# Patient Record
Sex: Female | Born: 1975 | Race: White | Hispanic: Yes | Marital: Married | Smoking: Never smoker
Health system: Southern US, Community
[De-identification: ages and names within clinical notes are randomized; demographics above are authoritative.]

## PROBLEM LIST (undated history)

## (undated) DIAGNOSIS — D649 Anemia, unspecified: Secondary | ICD-10-CM

## (undated) DIAGNOSIS — D249 Benign neoplasm of unspecified breast: Secondary | ICD-10-CM

## (undated) HISTORY — DX: Anemia, unspecified: D64.9

## (undated) HISTORY — DX: Benign neoplasm of unspecified breast: D24.9

---

## 2006-10-04 ENCOUNTER — Other Ambulatory Visit: Admission: RE | Admit: 2006-10-04 | Discharge: 2006-10-04 | Payer: Self-pay | Admitting: Family Medicine

## 2008-01-16 ENCOUNTER — Other Ambulatory Visit: Admission: RE | Admit: 2008-01-16 | Discharge: 2008-01-16 | Payer: Self-pay | Admitting: Family Medicine

## 2008-10-06 ENCOUNTER — Emergency Department (HOSPITAL_COMMUNITY): Admission: EM | Admit: 2008-10-06 | Discharge: 2008-10-06 | Payer: Self-pay | Admitting: Emergency Medicine

## 2008-10-08 ENCOUNTER — Inpatient Hospital Stay (HOSPITAL_COMMUNITY): Admission: AD | Admit: 2008-10-08 | Discharge: 2008-10-08 | Payer: Self-pay | Admitting: Obstetrics and Gynecology

## 2008-10-19 ENCOUNTER — Inpatient Hospital Stay (HOSPITAL_COMMUNITY): Admission: AD | Admit: 2008-10-19 | Discharge: 2008-10-19 | Payer: Self-pay | Admitting: Obstetrics and Gynecology

## 2009-03-17 ENCOUNTER — Other Ambulatory Visit: Admission: RE | Admit: 2009-03-17 | Discharge: 2009-03-17 | Payer: Self-pay | Admitting: Family Medicine

## 2010-03-16 ENCOUNTER — Other Ambulatory Visit: Payer: Self-pay | Admitting: Obstetrics & Gynecology

## 2010-03-19 ENCOUNTER — Inpatient Hospital Stay (HOSPITAL_COMMUNITY): Admission: AD | Admit: 2010-03-19 | Discharge: 2010-03-22 | Payer: Self-pay | Admitting: Obstetrics and Gynecology

## 2010-11-12 LAB — CBC
HCT: 38.8 % (ref 36.0–46.0)
MCH: 31.9 pg (ref 26.0–34.0)
MCH: 32 pg (ref 26.0–34.0)
MCH: 32 pg (ref 26.0–34.0)
MCHC: 34.3 g/dL (ref 30.0–36.0)
MCHC: 34.4 g/dL (ref 30.0–36.0)
MCHC: 34.7 g/dL (ref 30.0–36.0)
MCV: 92 fL (ref 78.0–100.0)
MCV: 93 fL (ref 78.0–100.0)
MCV: 93.1 fL (ref 78.0–100.0)
Platelets: 131 10*3/uL — ABNORMAL LOW (ref 150–400)
Platelets: 145 10*3/uL — ABNORMAL LOW (ref 150–400)
Platelets: 149 10*3/uL — ABNORMAL LOW (ref 150–400)
RBC: 3.37 MIL/uL — ABNORMAL LOW (ref 3.87–5.11)
RDW: 13.6 % (ref 11.5–15.5)
RDW: 13.7 % (ref 11.5–15.5)

## 2010-11-12 LAB — RPR: RPR Ser Ql: NONREACTIVE

## 2010-12-13 LAB — ABO/RH: ABO/RH(D): A POS

## 2010-12-13 LAB — URINALYSIS, ROUTINE W REFLEX MICROSCOPIC
Glucose, UA: NEGATIVE mg/dL
Protein, ur: >300 mg/dL — AB
Specific Gravity, Urine: 1.009 (ref 1.005–1.030)
Urobilinogen, UA: 0.2 mg/dL (ref 0.0–1.0)

## 2010-12-13 LAB — HCG, QUANTITATIVE, PREGNANCY
hCG, Beta Chain, Quant, S: 61 m[IU]/mL — ABNORMAL HIGH (ref <5)
hCG, Beta Chain, Quant, S: 85 m[IU]/mL — ABNORMAL HIGH (ref <5)
hCG, Beta Chain, Quant, S: <2 m[IU]/mL (ref <5)

## 2010-12-13 LAB — POCT I-STAT, CHEM 8
Chloride: 106 mEq/L (ref 96–112)
HCT: 44 % (ref 36.0–46.0)
Potassium: 3.8 mEq/L (ref 3.5–5.1)
Sodium: 140 mEq/L (ref 135–145)

## 2010-12-13 LAB — CBC
Platelets: 225 10*3/uL (ref 150–400)
RBC: 4.89 MIL/uL (ref 3.87–5.11)
WBC: 11.1 10*3/uL — ABNORMAL HIGH (ref 4.0–10.5)

## 2010-12-13 LAB — WET PREP, GENITAL: Trich, Wet Prep: NONE SEEN

## 2010-12-13 LAB — URINE MICROSCOPIC-ADD ON

## 2010-12-13 LAB — URINE CULTURE: Colony Count: >=100000

## 2010-12-13 LAB — DIFFERENTIAL
Lymphocytes Relative: 10 % — ABNORMAL LOW (ref 12–46)
Lymphs Abs: 1.1 10*3/uL (ref 0.7–4.0)
Neutrophils Relative %: 85 % — ABNORMAL HIGH (ref 43–77)

## 2014-03-12 ENCOUNTER — Emergency Department (HOSPITAL_COMMUNITY)
Admission: EM | Admit: 2014-03-12 | Discharge: 2014-03-12 | Disposition: A | Discharge status: Home / Self Care | Payer: BC Managed Care – PPO | Attending: Emergency Medicine | Admitting: Emergency Medicine

## 2014-03-12 ENCOUNTER — Encounter (HOSPITAL_COMMUNITY): Payer: Self-pay | Admitting: Emergency Medicine

## 2014-03-12 DIAGNOSIS — T63481A Toxic effect of venom of other arthropod, accidental (unintentional), initial encounter: Secondary | ICD-10-CM

## 2014-03-12 DIAGNOSIS — Y93H2 Activity, gardening and landscaping: Secondary | ICD-10-CM | POA: Insufficient documentation

## 2014-03-12 DIAGNOSIS — T6391XA Toxic effect of contact with unspecified venomous animal, accidental (unintentional), initial encounter: Secondary | ICD-10-CM | POA: Insufficient documentation

## 2014-03-12 DIAGNOSIS — Y9289 Other specified places as the place of occurrence of the external cause: Secondary | ICD-10-CM | POA: Insufficient documentation

## 2014-03-12 DIAGNOSIS — T63461A Toxic effect of venom of wasps, accidental (unintentional), initial encounter: Secondary | ICD-10-CM | POA: Insufficient documentation

## 2014-03-12 MED ORDER — HYDROCODONE-ACETAMINOPHEN 5-325 MG PO TABS: 1.0000 | ORAL_TABLET | Freq: Four times a day (QID) | ORAL | Status: DC | PRN

## 2014-03-12 MED ORDER — DEXAMETHASONE SODIUM PHOSPHATE 4 MG/ML IJ SOLN
10.0000 mg | Freq: Once | INTRAMUSCULAR | Status: AC
  Administered 2014-03-12: 10 mg via INTRAMUSCULAR
  Filled 2014-03-12: qty 3

## 2014-03-12 MED ORDER — DIPHENHYDRAMINE HCL 25 MG PO CAPS
25.0000 mg | ORAL_CAPSULE | Freq: Once | ORAL | Status: AC
  Administered 2014-03-12: 25 mg via ORAL
  Filled 2014-03-12: qty 1

## 2014-03-12 MED ORDER — IBUPROFEN 800 MG PO TABS: 800.0000 mg | ORAL_TABLET | Freq: Three times a day (TID) | ORAL | Status: DC | PRN

## 2014-03-12 MED ORDER — PREDNISONE 50 MG PO TABS: 50.0000 mg | ORAL_TABLET | Freq: Every day | ORAL | Status: DC

## 2014-03-12 MED ORDER — HYDROCODONE-ACETAMINOPHEN 5-325 MG PO TABS
1.0000 | ORAL_TABLET | Freq: Once | ORAL | Status: AC
  Administered 2014-03-12: 1 via ORAL
  Filled 2014-03-12: qty 1

## 2014-03-12 MED ORDER — DEXAMETHASONE SODIUM PHOSPHATE 10 MG/ML IJ SOLN
10.0000 mg | Freq: Once | INTRAMUSCULAR | Status: DC
  Filled 2014-03-12: qty 1

## 2014-03-12 NOTE — ED Notes (Addendum)
Pt was gardening; was bitten by unknown vermin or insect. Right ring finger swollen. Denies allergeries. Did not take OTC

## 2014-03-12 NOTE — ED Provider Notes (Signed)
CSN: [id number]     Arrival date & time 03/12/14  1957 History  This chart was scribed for Dalia Heading, PA-C working with Merryl Hacker, MD by Randa Evens, ED Scribe. This patient was seen in room TR05C/TR05C and the patient's care was started at 8:14 PM.     Chief Complaint  Patient presents with  . Finger Injury   The history is provided by the patient. No language interpreter was used.   HPI Comments: Martha Blankenship is a 38 y.o. female who presents to the Emergency Department complaining of right finger injury to the 4th digit onset 30 minutes prior to arriavl. She states she may have been bit by an insect. She states the finger has started to swell and turn red. She denies any other symptoms.   History reviewed. No pertinent past medical history. History reviewed. No pertinent past surgical history. History reviewed. No pertinent family history. History  Substance Use Topics  . Smoking status: Not on file  . Smokeless tobacco: Not on file  . Alcohol Use: Not on file   OB History   Grav Para Term Preterm Abortions TAB SAB Ect Mult Living                 Review of Systems  Skin: Positive for color change and rash.   A complete 10 system review of systems was obtained and all systems are negative except as noted in the HPI and PMH.     Allergies  Review of patient's allergies indicates not on file.  Home Medications   Prior to Admission medications   Not on File   Triage Vitals: BP 142/95  Pulse 79  Temp(Src) 97.9 F (36.6 C) (Oral)  Resp 18  Wt 145 lb (65.772 kg)  SpO2 100%  LMP 01/13/2014  Physical Exam  Nursing note and vitals reviewed. Constitutional: She is oriented to person, place, and time. She appears well-developed and well-nourished. No distress.  HENT:  Head: Normocephalic and atraumatic.  Pulmonary/Chest: Effort normal. No respiratory distress.  Musculoskeletal:       Hands: Neurological: She is alert and oriented to person, place,  and time.  Skin: Skin is warm and dry.  Psychiatric: She has a normal mood and affect. Her behavior is normal.    ED Course  Procedures (including critical care time) DIAGNOSTIC STUDIES: Oxygen Saturation is 100% on RA, normal by my interpretation.    COORDINATION OF CARE:  Patient retreated for some sort of insect bite to the finger.  Told to ice and elevate the finger.  Told to return here as needed.      Brent General, PA-C 03/12/14 2045

## 2014-03-12 NOTE — ED Notes (Signed)
Declined W/C at D/C and was escorted to lobby by RN. 

## 2014-03-12 NOTE — Discharge Instructions (Signed)
Benadryl at home, as well.  Ice and elevate the finger

## 2014-03-13 NOTE — ED Provider Notes (Signed)
Medical screening examination/treatment/procedure(s) were performed by non-physician practitioner and as supervising physician I was immediately available for consultation/collaboration.   EKG Interpretation None        Merryl Hacker, MD 03/13/14 [phone]

## 2014-12-27 HISTORY — PX: BREAST BIOPSY: SHX20

## 2014-12-28 ENCOUNTER — Other Ambulatory Visit: Payer: Self-pay | Admitting: Obstetrics

## 2014-12-28 DIAGNOSIS — R928 Other abnormal and inconclusive findings on diagnostic imaging of breast: Secondary | ICD-10-CM

## 2014-12-30 ENCOUNTER — Ambulatory Visit
Admission: RE | Admit: 2014-12-30 | Discharge: 2014-12-30 | Disposition: A | Discharge status: Home / Self Care | Payer: BLUE CROSS/BLUE SHIELD | Source: Ambulatory Visit | Attending: Obstetrics | Admitting: Obstetrics

## 2014-12-30 ENCOUNTER — Other Ambulatory Visit: Payer: Self-pay | Admitting: Obstetrics

## 2014-12-30 DIAGNOSIS — R928 Other abnormal and inconclusive findings on diagnostic imaging of breast: Secondary | ICD-10-CM

## 2015-01-08 ENCOUNTER — Ambulatory Visit
Admission: RE | Admit: 2015-01-08 | Discharge: 2015-01-08 | Disposition: A | Discharge status: Home / Self Care | Payer: BLUE CROSS/BLUE SHIELD | Source: Ambulatory Visit | Attending: Obstetrics | Admitting: Obstetrics

## 2015-01-08 DIAGNOSIS — R928 Other abnormal and inconclusive findings on diagnostic imaging of breast: Secondary | ICD-10-CM

## 2016-12-12 DIAGNOSIS — Z13220 Encounter for screening for lipoid disorders: Secondary | ICD-10-CM | POA: Diagnosis not present

## 2016-12-12 DIAGNOSIS — Z23 Encounter for immunization: Secondary | ICD-10-CM | POA: Diagnosis not present

## 2016-12-12 DIAGNOSIS — Z0001 Encounter for general adult medical examination with abnormal findings: Secondary | ICD-10-CM | POA: Diagnosis not present

## 2016-12-12 DIAGNOSIS — Z131 Encounter for screening for diabetes mellitus: Secondary | ICD-10-CM | POA: Diagnosis not present

## 2017-01-05 DIAGNOSIS — Z6826 Body mass index (BMI) 26.0-26.9, adult: Secondary | ICD-10-CM | POA: Diagnosis not present

## 2017-01-05 DIAGNOSIS — Z1231 Encounter for screening mammogram for malignant neoplasm of breast: Secondary | ICD-10-CM | POA: Diagnosis not present

## 2018-02-07 DIAGNOSIS — Z01419 Encounter for gynecological examination (general) (routine) without abnormal findings: Secondary | ICD-10-CM | POA: Diagnosis not present

## 2018-02-07 DIAGNOSIS — Z124 Encounter for screening for malignant neoplasm of cervix: Secondary | ICD-10-CM | POA: Diagnosis not present

## 2018-02-07 DIAGNOSIS — Z1231 Encounter for screening mammogram for malignant neoplasm of breast: Secondary | ICD-10-CM | POA: Diagnosis not present

## 2018-02-07 DIAGNOSIS — Z6827 Body mass index (BMI) 27.0-27.9, adult: Secondary | ICD-10-CM | POA: Diagnosis not present

## 2018-02-07 DIAGNOSIS — B373 Candidiasis of vulva and vagina: Secondary | ICD-10-CM | POA: Diagnosis not present

## 2018-02-08 ENCOUNTER — Other Ambulatory Visit: Payer: Self-pay | Admitting: Obstetrics

## 2018-02-08 DIAGNOSIS — R928 Other abnormal and inconclusive findings on diagnostic imaging of breast: Secondary | ICD-10-CM

## 2018-02-11 ENCOUNTER — Ambulatory Visit
Admission: RE | Admit: 2018-02-11 | Discharge: 2018-02-11 | Disposition: A | Discharge status: Home / Self Care | Payer: BLUE CROSS/BLUE SHIELD | Source: Ambulatory Visit | Attending: Obstetrics | Admitting: Obstetrics

## 2018-02-11 DIAGNOSIS — R921 Mammographic calcification found on diagnostic imaging of breast: Secondary | ICD-10-CM | POA: Diagnosis not present

## 2018-02-11 DIAGNOSIS — R928 Other abnormal and inconclusive findings on diagnostic imaging of breast: Secondary | ICD-10-CM

## 2019-01-30 DIAGNOSIS — K219 Gastro-esophageal reflux disease without esophagitis: Secondary | ICD-10-CM | POA: Diagnosis not present

## 2019-01-30 DIAGNOSIS — R1013 Epigastric pain: Secondary | ICD-10-CM | POA: Diagnosis not present

## 2019-01-30 DIAGNOSIS — D509 Iron deficiency anemia, unspecified: Secondary | ICD-10-CM | POA: Diagnosis not present

## 2019-01-30 DIAGNOSIS — R140 Abdominal distension (gaseous): Secondary | ICD-10-CM | POA: Diagnosis not present

## 2019-01-30 DIAGNOSIS — R110 Nausea: Secondary | ICD-10-CM | POA: Diagnosis not present

## 2019-02-03 DIAGNOSIS — R1013 Epigastric pain: Secondary | ICD-10-CM | POA: Diagnosis not present

## 2019-02-03 DIAGNOSIS — K317 Polyp of stomach and duodenum: Secondary | ICD-10-CM | POA: Diagnosis not present

## 2019-02-03 DIAGNOSIS — K219 Gastro-esophageal reflux disease without esophagitis: Secondary | ICD-10-CM | POA: Diagnosis not present

## 2019-02-03 DIAGNOSIS — K3189 Other diseases of stomach and duodenum: Secondary | ICD-10-CM | POA: Diagnosis not present

## 2019-02-18 DIAGNOSIS — K219 Gastro-esophageal reflux disease without esophagitis: Secondary | ICD-10-CM | POA: Diagnosis not present

## 2019-02-18 DIAGNOSIS — R140 Abdominal distension (gaseous): Secondary | ICD-10-CM | POA: Diagnosis not present

## 2019-02-18 DIAGNOSIS — D509 Iron deficiency anemia, unspecified: Secondary | ICD-10-CM | POA: Diagnosis not present

## 2019-03-21 DIAGNOSIS — Z6827 Body mass index (BMI) 27.0-27.9, adult: Secondary | ICD-10-CM | POA: Diagnosis not present

## 2019-03-21 DIAGNOSIS — Z1231 Encounter for screening mammogram for malignant neoplasm of breast: Secondary | ICD-10-CM | POA: Diagnosis not present

## 2019-03-21 DIAGNOSIS — Z01419 Encounter for gynecological examination (general) (routine) without abnormal findings: Secondary | ICD-10-CM | POA: Diagnosis not present

## 2019-03-24 ENCOUNTER — Other Ambulatory Visit: Payer: Self-pay | Admitting: Obstetrics

## 2019-03-24 DIAGNOSIS — R928 Other abnormal and inconclusive findings on diagnostic imaging of breast: Secondary | ICD-10-CM

## 2019-03-28 ENCOUNTER — Other Ambulatory Visit: Payer: Self-pay

## 2019-03-28 ENCOUNTER — Ambulatory Visit: Payer: BLUE CROSS/BLUE SHIELD

## 2019-03-28 ENCOUNTER — Ambulatory Visit
Admission: RE | Admit: 2019-03-28 | Discharge: 2019-03-28 | Disposition: A | Discharge status: Home / Self Care | Payer: BLUE CROSS/BLUE SHIELD | Source: Ambulatory Visit | Attending: Obstetrics | Admitting: Obstetrics

## 2019-03-28 DIAGNOSIS — R928 Other abnormal and inconclusive findings on diagnostic imaging of breast: Secondary | ICD-10-CM

## 2019-03-28 DIAGNOSIS — R922 Inconclusive mammogram: Secondary | ICD-10-CM | POA: Diagnosis not present

## 2019-07-02 ENCOUNTER — Encounter: Payer: Self-pay | Admitting: Family Medicine

## 2019-07-02 ENCOUNTER — Other Ambulatory Visit: Payer: Self-pay

## 2019-07-02 ENCOUNTER — Ambulatory Visit (INDEPENDENT_AMBULATORY_CARE_PROVIDER_SITE_OTHER): Payer: BC Managed Care – PPO | Admitting: Family Medicine

## 2019-07-02 VITALS — BP 144/90 | HR 92 | Temp 98.3°F | Resp 16 | Ht 60.0 in | Wt 143.8 lb

## 2019-07-02 DIAGNOSIS — K219 Gastro-esophageal reflux disease without esophagitis: Secondary | ICD-10-CM

## 2019-07-02 DIAGNOSIS — D500 Iron deficiency anemia secondary to blood loss (chronic): Secondary | ICD-10-CM

## 2019-07-02 DIAGNOSIS — Z8639 Personal history of other endocrine, nutritional and metabolic disease: Secondary | ICD-10-CM | POA: Diagnosis not present

## 2019-07-02 DIAGNOSIS — R140 Abdominal distension (gaseous): Secondary | ICD-10-CM

## 2019-07-02 DIAGNOSIS — D509 Iron deficiency anemia, unspecified: Secondary | ICD-10-CM | POA: Insufficient documentation

## 2019-07-02 DIAGNOSIS — N920 Excessive and frequent menstruation with regular cycle: Secondary | ICD-10-CM

## 2019-07-02 DIAGNOSIS — D249 Benign neoplasm of unspecified breast: Secondary | ICD-10-CM | POA: Insufficient documentation

## 2019-07-02 HISTORY — DX: Gastro-esophageal reflux disease without esophagitis: K21.9

## 2019-07-02 LAB — CBC WITH DIFFERENTIAL/PLATELET
Basophils Absolute: 0 10*3/uL (ref 0.0–0.1)
Basophils Relative: 0.6 % (ref 0.0–3.0)
Eosinophils Absolute: 0.4 10*3/uL (ref 0.0–0.7)
Eosinophils Relative: 5.7 % — ABNORMAL HIGH (ref 0.0–5.0)
HCT: 39.5 % (ref 36.0–46.0)
Hemoglobin: 13.2 g/dL (ref 12.0–15.0)
Lymphocytes Relative: 26.9 % (ref 12.0–46.0)
Lymphs Abs: 2 10*3/uL (ref 0.7–4.0)
MCHC: 33.6 g/dL (ref 30.0–36.0)
MCV: 86.5 fl (ref 78.0–100.0)
Monocytes Absolute: 0.5 10*3/uL (ref 0.1–1.0)
Monocytes Relative: 6.5 % (ref 3.0–12.0)
Neutro Abs: 4.5 10*3/uL (ref 1.4–7.7)
Neutrophils Relative %: 60.3 % (ref 43.0–77.0)
Platelets: 262 10*3/uL (ref 150.0–400.0)
RBC: 4.56 Mil/uL (ref 3.87–5.11)
RDW: 12.8 % (ref 11.5–15.5)
WBC: 7.4 10*3/uL (ref 4.0–10.5)

## 2019-07-02 LAB — BASIC METABOLIC PANEL
BUN: 12 mg/dL (ref 6–23)
CO2: 29 mEq/L (ref 19–32)
Calcium: 8.8 mg/dL (ref 8.4–10.5)
Chloride: 103 mEq/L (ref 96–112)
Creatinine, Ser: 0.59 mg/dL (ref 0.40–1.20)
GFR: 110.15 mL/min (ref >60.00)
Glucose, Bld: 91 mg/dL (ref 70–99)
Potassium: 3.8 mEq/L (ref 3.5–5.1)
Sodium: 138 mEq/L (ref 135–145)

## 2019-07-02 LAB — HEMOGLOBIN A1C: Hgb A1c MFr Bld: 5.4 % (ref 4.6–6.5)

## 2019-07-02 NOTE — Progress Notes (Signed)
Subjective  CC:  Chief Complaint  Patient presents with  . Establish Care    Previous PCP was Dr. Sondra Barges at [facility]  . [place]    Had been taking Ferrous 325mg  daily, has not taking in 1 month wants level checked    HPI: Martha Blankenship is a 43 y.o. female who presents to [facility] [place] today to establish care with me as a new patient.   She has the following concerns or needs:  43 yo hispanic married mother of 2 sons, presents for f/u on iron deficiency anemia. Took iron x 3 months. Heavy menses managed by GYN.   H/o GERD and bloating. Reports saw GI several months ago and had normal EGD. Feels better on meds but still bloating after meals. No Bowel changes. No weight loss. Eats hispanic diet and fast food mainly. Little fiber, little veggies  Reports h/o prediabetes. No sxs of hyperglycemia. 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 Reports nl recent mammo at gyn.   Refuses flu shot.   Assessment  1. Iron deficiency anemia due to chronic blood loss   2. Gastroesophageal reflux disease without esophagitis   3. Bloating   4. Menorrhagia with regular cycle   5. History of hyperglycemia   6. 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 Discussed healthy diet to help with bloating sxs. See avs. Screen for diabetes.   rec cpe in 6 months.  Will request gi records.   Follow up:  Return in about 6 months (around 12/30/2019) for complete physical. Orders Placed This Encounter  Procedures  . CBC with Differential/Platelet  . Iron, TIBC and Ferritin Panel  . Basic metabolic panel  . Hemoglobin A1c   No orders of the defined types were placed in this encounter.    Depression screen PHQ 2/9 07/02/2019  Decreased Interest 0  Down, Depressed, Hopeless 0  PHQ - 2 Score 0    We updated and reviewed the patient's past history in detail and it is documented below.  Patient Active Problem List   Diagnosis Date Noted  . GERD (gastroesophageal reflux disease) 07/02/2019  . Menorrhagia with regular cycle 07/02/2019  . Iron deficiency anemia due to chronic blood loss 07/02/2019  . Bloating 07/02/2019  . 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 TETANUS/TDAP  08/29/2019  . MAMMOGRAM  03/20/2020  . PAP SMEAR-Modifier  02/07/2021  . HIV Screening  Completed  . INFLUENZA VACCINE  Discontinued    There is no immunization history on file for this patient. No outpatient medications have been marked as taking for the 07/02/19 encounter (Office Visit) with Leamon Arnt, MD.    Allergies: Patient has No Known Allergies. 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 Past Surgical History Patient  has a past surgical history that includes Cesarean section and Breast biopsy (Right, 12/2014). Family History: Patient family history includes Alzheimer's disease in her maternal grandmother; Bone cancer in her maternal grandmother; Diabetes in her mother; Healthy in her son; Hypertension in her father and paternal grandfather. Social History:  Patient  reports that she has never smoked. She has never used smokeless tobacco. She reports previous alcohol use. She reports that she does not use drugs.  Review of Systems: Constitutional: negative  for fever or malaise Ophthalmic: negative for photophobia, double vision or loss of vision Cardiovascular: negative for chest pain, dyspnea on exertion, or new LE swelling Respiratory: negative for SOB or persistent cough Gastrointestinal: negative for abdominal pain, change in bowel habits or melena Genitourinary: negative for dysuria or gross hematuria Musculoskeletal: negative for new gait disturbance or muscular weakness Integumentary: negative for new or persistent rashes  Neurological: negative for TIA or stroke symptoms Psychiatric: negative for SI or delusions Allergic/Immunologic: negative for hives  Patient Care Team    Relationship Specialty Notifications Start End  Leamon Arnt, MD PCP - General Family Medicine  07/02/19   Jerelyn Charles, MD Consulting Physician Obstetrics  07/02/19     Objective  Vitals: BP (!) 150/90   Pulse 92   Temp 98.3 F (36.8 C) (Oral)   Resp 16   Ht 5' (1.524 m)   Wt 143 lb 12.8 oz (65.2 kg)   LMP 06/05/2019   SpO2 98%   BMI 28.08 kg/m  General:  Well developed, well nourished, no acute distress  Psych:  Alert and oriented,normal mood and affect HEENT:  Normocephalic, atraumatic, non-icteric sclera, pink conjunctiva, PERRL, oropharynx is without mass or exudate, supple neck without adenopathy, mass or thyromegaly Cardiovascular:  RRR without gallop, rub or murmur, nondisplaced PMI Respiratory:  Good breath sounds bilaterally, CTAB with normal respiratory effort Gastrointestinal: normal bowel sounds, soft, non-tender, no noted masses. No HSM MSK: no deformities, contusions. Joints are without erythema or swelling, pink nail beds Skin:  Warm, no rashes or suspicious lesions noted Neurologic:    Mental status is normal. Gross motor and sensory exams are normal. Normal gait    Commons side effects, risks, benefits, and alternatives for medications and treatment plan prescribed today were discussed, and the patient expressed understanding of the given instructions. Patient is instructed to call or message via MyChart if he/she has any questions or concerns regarding our treatment plan. No barriers to understanding were identified. We discussed Red Flag symptoms and signs in detail. Patient expressed understanding regarding what to do in case of urgent or emergency type symptoms.   Medication list was reconciled, printed and provided to the patient in AVS. Patient instructions and summary information was reviewed with the  patient as documented in the AVS. This note was prepared with assistance of Dragon voice recognition software. Occasional wrong-word or sound-a-like substitutions may have occurred due to the inherent limitations of voice recognition software

## 2019-07-02 NOTE — Patient Instructions (Signed)
Please return in 3-6 months for your annual complete physical; please come fasting. Please leave the name of your gastroenterologist and be sure to sign a release of information so I may get those records.   Please sign up for mychart.  I will release your lab results to you on your MyChart account with further instructions. Please reply with any questions.    It was a pleasure meeting you today! Thank you for choosing [place] to meet your healthcare needs! I truly look forward to working with you. If you have any questions or concerns, please send me a message via Mychart or call the office at [phone].  Eat a healthy diet to help your stomach symptoms.

## 2019-07-03 LAB — IRON,TIBC AND FERRITIN PANEL
%SAT: 13 % (calc) — ABNORMAL LOW (ref 16–45)
Ferritin: 8 ng/mL — ABNORMAL LOW (ref 16–232)
Iron: 52 ug/dL (ref 40–190)
TIBC: 390 mcg/dL (calc) (ref 250–450)

## 2019-07-03 NOTE — Progress Notes (Signed)
Please call patient: I have reviewed his/her lab results. Anemia has resolved with the 3 months of iron therapy, however, iron stores remain low, so I recommend continuing daily iron once a day. Sugar test is normal. Follow up as directed.

## 2020-01-01 ENCOUNTER — Other Ambulatory Visit: Payer: Self-pay

## 2020-01-01 ENCOUNTER — Ambulatory Visit (INDEPENDENT_AMBULATORY_CARE_PROVIDER_SITE_OTHER): Payer: BC Managed Care – PPO | Admitting: Family Medicine

## 2020-01-01 ENCOUNTER — Encounter: Payer: Self-pay | Admitting: Family Medicine

## 2020-01-01 VITALS — BP 140/98 | HR 88 | Temp 97.6°F | Resp 15 | Ht 60.0 in | Wt 147.6 lb

## 2020-01-01 DIAGNOSIS — S83411A Sprain of medial collateral ligament of right knee, initial encounter: Secondary | ICD-10-CM

## 2020-01-01 DIAGNOSIS — R030 Elevated blood-pressure reading, without diagnosis of hypertension: Secondary | ICD-10-CM | POA: Diagnosis not present

## 2020-01-01 DIAGNOSIS — Z23 Encounter for immunization: Secondary | ICD-10-CM | POA: Diagnosis not present

## 2020-01-01 DIAGNOSIS — N920 Excessive and frequent menstruation with regular cycle: Secondary | ICD-10-CM

## 2020-01-01 DIAGNOSIS — D500 Iron deficiency anemia secondary to blood loss (chronic): Secondary | ICD-10-CM

## 2020-01-01 DIAGNOSIS — Z0000 Encounter for general adult medical examination without abnormal findings: Secondary | ICD-10-CM | POA: Diagnosis not present

## 2020-01-01 DIAGNOSIS — K219 Gastro-esophageal reflux disease without esophagitis: Secondary | ICD-10-CM | POA: Diagnosis not present

## 2020-01-01 DIAGNOSIS — L989 Disorder of the skin and subcutaneous tissue, unspecified: Secondary | ICD-10-CM

## 2020-01-01 LAB — LIPID PANEL
Cholesterol: 172 mg/dL (ref 0–200)
HDL: 32.1 mg/dL — ABNORMAL LOW (ref >39.00)
LDL Cholesterol: 104 mg/dL — ABNORMAL HIGH (ref 0–99)
NonHDL: 140.09
Total CHOL/HDL Ratio: 5
Triglycerides: 181 mg/dL — ABNORMAL HIGH (ref 0.0–149.0)
VLDL: 36.2 mg/dL (ref 0.0–40.0)

## 2020-01-01 LAB — COMPREHENSIVE METABOLIC PANEL
ALT: 21 U/L (ref 0–35)
AST: 20 U/L (ref 0–37)
Albumin: 4.1 g/dL (ref 3.5–5.2)
Alkaline Phosphatase: 62 U/L (ref 39–117)
BUN: 14 mg/dL (ref 6–23)
CO2: 28 mEq/L (ref 19–32)
Calcium: 8.9 mg/dL (ref 8.4–10.5)
Chloride: 103 mEq/L (ref 96–112)
Creatinine, Ser: 0.59 mg/dL (ref 0.40–1.20)
GFR: 109.9 mL/min (ref >60.00)
Glucose, Bld: 104 mg/dL — ABNORMAL HIGH (ref 70–99)
Potassium: 4 mEq/L (ref 3.5–5.1)
Sodium: 138 mEq/L (ref 135–145)
Total Bilirubin: 0.5 mg/dL (ref 0.2–1.2)
Total Protein: 7 g/dL (ref 6.0–8.3)

## 2020-01-01 LAB — CBC WITH DIFFERENTIAL/PLATELET
Basophils Absolute: 0.1 10*3/uL (ref 0.0–0.1)
Basophils Relative: 0.7 % (ref 0.0–3.0)
Eosinophils Absolute: 0.5 10*3/uL (ref 0.0–0.7)
Eosinophils Relative: 6.3 % — ABNORMAL HIGH (ref 0.0–5.0)
HCT: 40.2 % (ref 36.0–46.0)
Hemoglobin: 13.6 g/dL (ref 12.0–15.0)
Lymphocytes Relative: 23.7 % (ref 12.0–46.0)
Lymphs Abs: 1.9 10*3/uL (ref 0.7–4.0)
MCHC: 33.8 g/dL (ref 30.0–36.0)
MCV: 89.1 fl (ref 78.0–100.0)
Monocytes Absolute: 0.5 10*3/uL (ref 0.1–1.0)
Monocytes Relative: 6.8 % (ref 3.0–12.0)
Neutro Abs: 5.1 10*3/uL (ref 1.4–7.7)
Neutrophils Relative %: 62.5 % (ref 43.0–77.0)
Platelets: 284 10*3/uL (ref 150.0–400.0)
RBC: 4.51 Mil/uL (ref 3.87–5.11)
RDW: 13.2 % (ref 11.5–15.5)
WBC: 8.1 10*3/uL (ref 4.0–10.5)

## 2020-01-01 LAB — TSH: TSH: 0.86 u[IU]/mL (ref 0.35–4.50)

## 2020-01-01 MED ORDER — DICLOFENAC SODIUM 75 MG PO TBEC: 75.0000 mg | DELAYED_RELEASE_TABLET | Freq: Two times a day (BID) | ORAL | 0 refills | Status: DC

## 2020-01-01 NOTE — Patient Instructions (Addendum)
Please return in 3 weeks to recheck knee and blood pressure.   Please sign up for Mychart. I have texted you the link. I will release your lab results to you on your MyChart account with further instructions. Please reply with any questions.   Please ice your knee and take the anti-inflammatory medication twice daily with food. Wear a neoprene knee sleeve during the day.   We will call you with information regarding your referral appointment. Martha Blankenship, [place], dermatologist. If you do not hear from [place] within the next 2 weeks, please let me know. It can take 1-2 weeks to get appointments set up with the specialists.   If you have any questions or concerns, please don't hesitate to send me a message via MyChart or call the office at [phone]. Thank you for visiting with [place] today! It's our pleasure caring for you.   Preventive Care 46-46 Years Old, Female Preventive care refers to visits with your health care provider and lifestyle choices that can promote health and wellness. This includes:  A yearly physical exam. This may also be called an annual well check.  Regular dental visits and eye exams.  Immunizations.  Screening for certain conditions.  Healthy lifestyle choices, such as eating a healthy diet, getting regular exercise, not using drugs or products that contain nicotine and tobacco, and limiting alcohol use. What can I expect for my preventive care visit? Physical exam Your health care provider will check your:  Height and weight. This may be used to calculate body mass index (BMI), which tells if you are at a healthy weight.  Heart rate and blood pressure.  Skin for abnormal spots. Counseling Your health care provider may ask you questions about your:  Alcohol, tobacco, and drug use.  Emotional well-being.  Home and relationship well-being.  Sexual activity.  Eating habits.  Work and work Statistician.  Method of birth control.  Menstrual  cycle.  Pregnancy history. What immunizations do I need?  Influenza (flu) vaccine  This is recommended every year. Tetanus, diphtheria, and pertussis (Tdap) vaccine  You may need a Td booster every 10 years. Varicella (chickenpox) vaccine  You may need this if you have not been vaccinated. Zoster (shingles) vaccine  You may need this after age 80. Measles, mumps, and rubella (MMR) vaccine  You may need at least one dose of MMR if you were born in 1957 or later. You may also need a second dose. Pneumococcal conjugate (PCV13) vaccine  You may need this if you have certain conditions and were not previously vaccinated. Pneumococcal polysaccharide (PPSV23) vaccine  You may need one or two doses if you smoke cigarettes or if you have certain conditions. Meningococcal conjugate (MenACWY) vaccine  You may need this if you have certain conditions. Hepatitis A vaccine  You may need this if you have certain conditions or if you travel or work in places where you may be exposed to hepatitis A. Hepatitis B vaccine  You may need this if you have certain conditions or if you travel or work in places where you may be exposed to hepatitis B. Haemophilus influenzae type b (Hib) vaccine  You may need this if you have certain conditions. Human papillomavirus (HPV) vaccine  If recommended by your health care provider, you may need three doses over 6 months. You may receive vaccines as individual doses or as more than one vaccine together in one shot (combination vaccines). Talk with your health care provider about the risks and benefits  of combination vaccines. What tests do I need? Blood tests  Lipid and cholesterol levels. These may be checked every 5 years, or more frequently if you are over 46 years old.  Hepatitis C test.  Hepatitis B test. Screening  Lung cancer screening. You may have this screening every year starting at age 46 if you have a 30-pack-year history of smoking and  currently smoke or have quit within the past 15 years.  Colorectal cancer screening. All adults should have this screening starting at age 46 and continuing until age 42. Your health care provider may recommend screening at age 75 if you are at increased risk. You will have tests every 1-10 years, depending on your results and the type of screening test.  Diabetes screening. This is done by checking your blood sugar (glucose) after you have not eaten for a while (fasting). You may have this done every 1-3 years.  Mammogram. This may be done every 1-2 years. Talk with your health care provider about when you should start having regular mammograms. This may depend on whether you have a family history of breast cancer.  BRCA-related cancer screening. This may be done if you have a family history of breast, ovarian, tubal, or peritoneal cancers.  Pelvic exam and Pap test. This may be done every 3 years starting at age 46. Starting at age 82, this may be done every 5 years if you have a Pap test in combination with an HPV test. Other tests  Sexually transmitted disease (STD) testing.  Bone density scan. This is done to screen for osteoporosis. You may have this scan if you are at high risk for osteoporosis. Follow these instructions at home: Eating and drinking  Eat a diet that includes fresh fruits and vegetables, whole grains, lean protein, and low-fat dairy.  Take vitamin and mineral supplements as recommended by your health care provider.  Do not drink alcohol if: ? Your health care provider tells you not to drink. ? You are pregnant, may be pregnant, or are planning to become pregnant.  If you drink alcohol: ? Limit how much you have to 0-1 drink a day. ? Be aware of how much alcohol is in your drink. In the [place] one drink equals one 12 oz bottle of beer (355 mL), one 5 oz glass of wine (148 mL), or one 1 oz glass of hard liquor (44 mL). Lifestyle  Take daily care of your teeth and  gums.  Stay active. Exercise for at least 30 minutes on 5 or more days each week.  Do not use any products that contain nicotine or tobacco, such as cigarettes, e-cigarettes, and chewing tobacco. If you need help quitting, ask your health care provider.  If you are sexually active, practice safe sex. Use a condom or other form of birth control (contraception) in order to prevent pregnancy and STIs (sexually transmitted infections).  If told by your health care provider, take low-dose aspirin daily starting at age 21. What's next?  Visit your health care provider once a year for a well check visit.  Ask your health care provider how often you should have your eyes and teeth checked.  Stay up to date on all vaccines. This information is not intended to replace advice given to you by your health care provider. Make sure you discuss any questions you have with your health care provider. Document Revised: 04/25/2018 Document Reviewed: 04/25/2018 Elsevier Patient Education  2020 [employer].

## 2020-01-01 NOTE — Progress Notes (Signed)
Subjective  Chief Complaint  Patient presents with  . Annual Exam    right knee pain for two weeks, denies any injuries    HPI: Martha Blankenship is a 44 y.o. female who presents to [facility] today for a Female Wellness Visit.  She also has the concerns and/or needs as listed above in the chief complaint. These will be addressed in addition to the Health Maintenance Visit.   Wellness Visit: annual visit with health maintenance review and exam without Pap   HM: up to date pap and mammo. Sees GYN. Due tdap.  Chronic disease management visit and/or acute problem visit:  GERD: now resolved on PPI and with diet changes: avoids spicy foods and alcohol. No more bloating sxs. Feels well  IDA: taking iron bid and tolerating well. Still with intermittently heavy cycles. Sees gyn  Right knee pain: 2 weeks of acute onset right medial knee pain. No known injury but works as Secretary/administrator and bends/stoops/twists often. minimal swelling. Painful with walking mostly but has aching even with sitting. Most painful if tries to "twist" the knee. No posterior pain or calf pain. No prior knee problems / injuries. Limping. Has tried tylenol and icy hot with minimal relief.   Elevated BP: no h/o HTN. Could be pain response today. No cp or sob.   Skin lesion on bridge of nose that bothers her when she wears her glasses. Reports saw a derm a few years ago who said it was nothing. It is larger and gets irritated. She would like it reevaluated and/or removed.   Assessment  1. Annual physical exam   2. Gastroesophageal reflux disease without esophagitis   3. Iron deficiency anemia due to chronic blood loss   4. Menorrhagia with regular cycle   5. Elevated blood pressure reading without diagnosis of hypertension   6. Sprain of medial collateral ligament of right knee, initial encounter   7. Skin lesion of face      Plan  Female Wellness Visit:  Age appropriate Health Maintenance  and Prevention measures were discussed with patient. Included topics are cancer screening recommendations, ways to keep healthy (see AVS) including dietary and exercise recommendations, regular eye and dental care, use of seat belts, and avoidance of moderate alcohol use and tobacco use.  Screens up to date  BMI: discussed patient's BMI and encouraged positive lifestyle modifications to help get to or maintain a target BMI.  HM needs and immunizations were addressed and ordered. See below for orders. See HM and immunization section for updates. tdap today  Routine labs and screening tests ordered including cmp, cbc and lipids where appropriate.  Discussed recommendations regarding Vit D and calcium supplementation (see AVS)  Chronic disease f/u and/or acute problem visit: (deemed necessary to be done in addition to the wellness visit):  GERD:  Now well controlled on PPI. Will consider lowering dose if remains stable.   IDA due menorrhagia: recheck levels on iron supplements.   Elevated BP: hopefully just pain response. Will treat knee pain and recheck in 3 weeks.   Knee sprain: RICE tx, knee sleeve, rest, nsaids bid w/ food and recheck.   Refer to derm for skin lesion on nose.   Follow up: Return in about 3 weeks (around 01/22/2020) for recheck knee and blood pressure.   Orders Placed This Encounter  Procedures  . Tdap vaccine greater than or equal to 7yo IM  . CBC with Differential/Platelet  . Comprehensive metabolic panel  .  Iron, TIBC and Ferritin Panel  . Lipid panel  . TSH  . Ambulatory referral to Dermatology   No orders of the defined types were placed in this encounter.     Lifestyle: Body mass index is 28.83 kg/m. Wt Readings from Last 3 Encounters:  01/01/20 147 lb 9.6 oz (67 kg)  07/02/19 143 lb 12.8 oz (65.2 kg)  03/12/14 145 lb (65.8 kg)   Diet: general hispanic, no etoh Exercise: rarely, housekeeper Need for contraception: No, coitus interruptus  Patient  Active Problem List   Diagnosis Date Noted  . GERD (gastroesophageal reflux disease) 07/02/2019  . Menorrhagia with regular cycle 07/02/2019  . Iron deficiency anemia due to chronic blood loss 07/02/2019  . Bloating 07/02/2019  . 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 MAMMOGRAM  03/20/2020  . PAP SMEAR-Modifier  02/07/2021  . TETANUS/TDAP  12/31/2029  . HIV Screening  Completed  . INFLUENZA VACCINE  Discontinued   Immunization History  Administered Date(s) Administered  . Tdap 01/01/2020   We updated and reviewed the patient's past history in detail and it is documented below. Allergies: Patient  reports previous alcohol use. 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 Past Surgical History Patient  has a past surgical history that includes Cesarean section and Breast biopsy (Right, 12/2014). Social History   Socioeconomic History  . Marital status: Married    Spouse name: Not on file  . Number of children: 2  . Years of education: Not on file  . Highest education level: Not on file  Occupational History  . Occupation: housekeeping  Tobacco Use  . Smoking status: Never Smoker  . Smokeless tobacco: Never Used  Substance and Sexual Activity  . Alcohol use: Not Currently  . Drug use: Never  . Sexual activity: Yes    Birth control/protection: None, Coitus interruptus  Other Topics Concern  . Not on file  Social History Narrative  . Not on file   Social Determinants of Health   Financial Resource Strain:   . Difficulty of Paying Living Expenses:   Food Insecurity:   . Worried About Charity fundraiser in the Last Year:   . Arboriculturist in the Last Year:   Transportation Needs:   . Film/video editor (Medical):   Marland Kitchen Lack of Transportation (Non-Medical):   Physical Activity:   . Days of  Exercise per Week:   . Minutes of Exercise per Session:   Stress:   . Feeling of Stress :   Social Connections:   . Frequency of Communication with Friends and Family:   . Frequency of Social Gatherings with Friends and Family:   . Attends Religious Services:   . Active Member of Clubs or Organizations:   . Attends Archivist Meetings:   Marland Kitchen Marital Status:    Family History  Problem Relation Age of Onset  . Diabetes Mother   . Hypertension Father   . Healthy Son   . Alzheimer's disease Maternal Grandmother   . Bone cancer Maternal Grandmother   . Hypertension Paternal Grandfather     Review of Systems: Constitutional: negative for fever or malaise Ophthalmic: negative for photophobia, double vision or loss of vision Cardiovascular: negative for chest pain, dyspnea on exertion, or new LE swelling Respiratory: negative for SOB or persistent cough Gastrointestinal: negative for abdominal  pain, change in bowel habits or melena Genitourinary: negative for dysuria or gross hematuria, no abnormal uterine bleeding or disharge Musculoskeletal: negative for new gait disturbance or muscular weakness Integumentary: negative for new or persistent rashes, no breast lumps Neurological: negative for TIA or stroke symptoms Psychiatric: negative for SI or delusions Allergic/Immunologic: negative for hives  Patient Care Team    Relationship Specialty Notifications Start End  Leamon Arnt, MD PCP - General Family Medicine  07/02/19   Jerelyn Charles, MD Consulting Physician Obstetrics  07/02/19   Juanita Craver, MD Consulting Physician Gastroenterology  07/02/19     Objective  Vitals: BP (!) 140/98 Comment: in pain, right knee  Pulse 88   Temp 97.6 F (36.4 C) (Temporal)   Resp 15   Ht 5' (1.524 m)   Wt 147 lb 9.6 oz (67 kg)   SpO2 98%   BMI 28.83 kg/m  General:  Well developed, well nourished, no acute distress  Psych:  Alert and orientedx3,normal mood and affect HEENT:   Normocephalic, atraumatic, non-icteric sclera, PERRL, supple neck without adenopathy, mass or thyromegaly Cardiovascular:  Normal S1, S2, RRR without gallop, rub or murmur Respiratory:  Good breath sounds bilaterally, CTAB with normal respiratory effort Gastrointestinal: normal bowel sounds, soft, non-tender, no noted masses. No HSM MSK: no deformities, contusions. Right knee with minimal soft tissue swelling, mild warmth medially, + medial lig ttp and pain with resistance w/o laxity, stable joint. FROM. No crepitus Skin:  Warm, no rashes or suspicious lesions noted, nasal bridge with 51mm red raised smooth lesion, non tender Neurologic:    Mental status is normal. Gross motor and sensory exams are normal. Normal gait. No tremor    Commons side effects, risks, benefits, and alternatives for medications and treatment plan prescribed today were discussed, and the patient expressed understanding of the given instructions. Patient is instructed to call or message via MyChart if he/she has any questions or concerns regarding our treatment plan. No barriers to understanding were identified. We discussed Red Flag symptoms and signs in detail. Patient expressed understanding regarding what to do in case of urgent or emergency type symptoms.   Medication list was reconciled, printed and provided to the patient in AVS. Patient instructions and summary information was reviewed with the patient as documented in the AVS. This note was prepared with assistance of Dragon voice recognition software. Occasional wrong-word or sound-a-like substitutions may have occurred due to the inherent limitations of voice recognition software  This visit occurred during the SARS-CoV-2 public health emergency.  Safety protocols were in place, including screening questions prior to the visit, additional usage of staff PPE, and extensive cleaning of exam room while observing appropriate contact time as indicated for disinfecting  solutions.

## 2020-01-02 LAB — IRON,TIBC AND FERRITIN PANEL
%SAT: 11 % (calc) — ABNORMAL LOW (ref 16–45)
Ferritin: 12 ng/mL — ABNORMAL LOW (ref 16–232)
Iron: 41 ug/dL (ref 40–190)
TIBC: 367 mcg/dL (calc) (ref 250–450)

## 2020-01-05 NOTE — Progress Notes (Signed)
Please call patient: I have reviewed his/her lab results. Results are stable; iron stores remain low: increase iron to tid if possible. Anemia has resolved. Other tests are fine. F/u as scheduled for repeat bp check and knee f/u in 3 weeks.sched appt if hasn't. Thanks.

## 2020-01-22 ENCOUNTER — Encounter: Payer: Self-pay | Admitting: Family Medicine

## 2020-01-22 ENCOUNTER — Other Ambulatory Visit: Payer: Self-pay

## 2020-01-22 ENCOUNTER — Ambulatory Visit: Payer: BC Managed Care – PPO | Admitting: Family Medicine

## 2020-01-22 VITALS — BP 138/88 | HR 85 | Temp 97.9°F | Resp 15 | Ht 60.0 in | Wt 144.6 lb

## 2020-01-22 DIAGNOSIS — D500 Iron deficiency anemia secondary to blood loss (chronic): Secondary | ICD-10-CM | POA: Diagnosis not present

## 2020-01-22 DIAGNOSIS — R030 Elevated blood-pressure reading, without diagnosis of hypertension: Secondary | ICD-10-CM

## 2020-01-22 DIAGNOSIS — S83411A Sprain of medial collateral ligament of right knee, initial encounter: Secondary | ICD-10-CM | POA: Diagnosis not present

## 2020-01-22 NOTE — Progress Notes (Signed)
Subjective  CC:  Chief Complaint  Patient presents with  . Knee Pain    states that brace is helping, wearing it the whole day, has noticed an improvement  . Hypertension    has experienced some headaches, light headiness, has not been checking BP at home    HPI: Martha Blankenship is a 44 y.o. female who presents to the office today to address the problems listed above in the chief complaint.  F/u medial knee sprain right: doing much better. No longer with pain or needing medicines. Will just get an occasional "twinge" if she goes to move suddenly. No swelling.   Elevated BP at last visit. Hasn't checked bp outside of office.   Iron deficiency: taking oral iron bid.    Assessment  1. Sprain of medial collateral ligament of right knee, initial encounter   2. Iron deficiency anemia due to chronic blood loss   3. Elevated blood pressure reading without diagnosis of hypertension      Plan   Knee sprain:  Resolved. May stop bracing.   IDA: increase iron to tid x 6-12 weeks, then back to qd/bid.   prehypertension today. Dash diet and recheckin 6 months.   Follow up: Return in about 6 months (around 07/24/2020) for rechekc blood pressure.  Visit date not found  No orders of the defined types were placed in this encounter.  No orders of the defined types were placed in this encounter.     I reviewed the patients updated PMH, FH, and SocHx.    Patient Active Problem List   Diagnosis Date Noted  . GERD (gastroesophageal reflux disease) 07/02/2019  . Menorrhagia with regular cycle 07/02/2019  . Iron deficiency anemia due to chronic blood loss 07/02/2019  . Bloating 07/02/2019  . 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 diclofenac (VOLTAREN) 75 MG EC tablet Take 1 tablet (75 mg total) by mouth 2 (two) times daily.  . ferrous sulfate 325 (65 FE) MG tablet Take 325 mg by mouth 2 (two) times daily with a meal.   . omeprazole (PRILOSEC) 40  MG capsule Take 1 capsule by mouth daily.  . tranexamic acid (LYSTEDA) 650 MG TABS tablet Take 1,300 mg by mouth 3 (three) times daily.    Allergies: Patient has No Known Allergies. Family History: Patient family history includes Alzheimer's disease in her maternal grandmother; Bone cancer in her maternal grandmother; Diabetes in her mother; Healthy in her son; Hypertension in her father and paternal grandfather. Social History:  Patient  reports that she has never smoked. She has never used smokeless tobacco. She reports previous alcohol use. She reports that she does not use drugs.  Review of Systems: Constitutional: Negative for fever malaise or anorexia Cardiovascular: negative for chest pain Respiratory: negative for SOB or persistent cough Gastrointestinal: negative for abdominal pain  Objective  Vitals: BP 138/88   Pulse 85   Temp 97.9 F (36.6 C) (Temporal)   Resp 15   Ht 5' (1.524 m)   Wt 144 lb 9.6 oz (65.6 kg)   SpO2 98%   BMI 28.24 kg/m  General: no acute distress , A&Ox3 Left knee: nl exam today w/o ttp, swelling or limping     Commons side effects, risks, benefits, and alternatives for medications and treatment plan prescribed today were discussed, and the patient expressed understanding of the given instructions. Patient is instructed to call or message via MyChart if he/she has any questions  or concerns regarding our treatment plan. No barriers to understanding were identified. We discussed Red Flag symptoms and signs in detail. Patient expressed understanding regarding what to do in case of urgent or emergency type symptoms.   Medication list was reconciled, printed and provided to the patient in AVS. Patient instructions and summary information was reviewed with the patient as documented in the AVS. This note was prepared with assistance of Dragon voice recognition software. Occasional wrong-word or sound-a-like substitutions may have occurred due to the  inherent limitations of voice recognition software  This visit occurred during the SARS-CoV-2 public health emergency.  Safety protocols were in place, including screening questions prior to the visit, additional usage of staff PPE, and extensive cleaning of exam room while observing appropriate contact time as indicated for disinfecting solutions.

## 2020-01-22 NOTE — Patient Instructions (Signed)
Please return in 6 months for blood pressure follow up.  I am glad you are feeling better.  Eat a low salt diet to help prevent high blood pressure.   If you have any questions or concerns, please don't hesitate to send me a message via MyChart or call the office at [phone]. Thank you for visiting with [place] today! It's our pleasure caring for you.   Plan de alimentacin DASH DASH Eating Plan DASH es la sigla en ingls de "Enfoques Alimentarios para Detener la Hipertensin" (Dietary Approaches to Stop Hypertension). El plan de alimentacin DASH ha demostrado bajar la presin arterial elevada (hipertensin). Tambin puede reducir [employer] de diabetes tipo 2, enfermedad cardaca y accidente cerebrovascular. Este plan tambin puede ayudar a Horticulturist, commercial. Consejos para seguir este plan  Pautas generales  Evite ingerir ms de 2,300 mg (miligramos) de sal (sodio) por da. Si tiene hipertensin, es posible que necesite reducir la ingesta de sodio a 1,500 mg por da.  Limite el consumo de alcohol a no ms de 27medida por da si es mujer y no est [place], y 29medidas por da si es hombre. Una medida equivale a 12oz (384ml) de cerveza, 5oz (196ml) de vino o 1oz (49ml) de bebidas alcohlicas de alta graduacin.  Trabaje con su mdico para mantener un peso saludable o perder [employer]. Pregntele cul es el peso recomendado para usted.  Realice al menos 30 minutos de ejercicio que haga que se acelere su corazn (ejercicio Arboriculturist) la [employer] de la [place]. Estas actividades pueden incluir caminar, nadar o andar en bicicleta.  Trabaje con su mdico o especialista en alimentacin y nutricin (nutricionista) para ajustar su plan alimentario a sus necesidades calricas personales. Lectura de las etiquetas de los alimentos   Verifique en las etiquetas de los alimentos, la cantidad de sodio por porcin. Elija alimentos con menos del 5 por ciento del valor diario de sodio. Generalmente, los  alimentos con menos de 300 mg de sodio por porcin se encuadran dentro de este plan alimentario.  Para encontrar cereales integrales, busque la palabra "integral" como primera palabra en la lista de ingredientes. De compras  Compre productos en los que en su etiqueta diga: "bajo contenido de sodio" o "sin agregado de sal".  Compre alimentos frescos. Evite los alimentos enlatados y comidas precocidas o congeladas. Coccin  Evite agregar sal cuando cocine. Use hierbas o aderezos sin sal, en lugar de sal de mesa o sal marina. Consulte al mdico o farmacutico antes de usar sustitutos de la sal.  No fra los alimentos. A la hora de cocinar los alimentos opte por hornearlos, hervirlos, grillarlos y asarlos a Administrator, arts.  Cocine con aceites cardiosaludables, como oliva, canola, soja o girasol. Planificacin de las comidas  Consuma una dieta equilibrada, que incluya lo siguiente: ? 5o ms porciones de frutas y Set designer. Trate de que la mitad del plato de cada comida sean frutas y verduras. ? Hasta 6 u 8 porciones de cereales integrales por da. ? Menos de 6 onzas de carne, aves o pescado Games developer. Una porcin de 3 onzas de carne tiene casi el mismo tamao que un mazo de cartas. Un huevo equivale a 1 onza. ? Dos porciones de productos lcteos descremados por Training and development officer. ? Una porcin de frutos secos, semillas o frijoles 5 veces por semana. ? Grasas cardiosaludables. Las grasas saludables llamadas cidos grasos omega-3 se encuentran en alimentos como semillas de lino y pescados de agua fra, como por ejemplo, sardinas, salmn  y caballa.  Limite la cantidad que ingiere de los siguientes alimentos: ? Alimentos enlatados o envasados. ? Alimentos con alto contenido de grasa trans, como alimentos fritos. ? Alimentos con alto contenido de grasa saturada, como carne con grasa. ? Dulces, postres, bebidas azucaradas y otros alimentos con azcar agregada. ? Productos lcteos enteros.  No le  agregue sal a los alimentos antes de probarlos.  Trate de comer al menos 2 comidas vegetarianas por semana.  Consuma ms comida casera y menos de restaurante, de bufs y comida rpida.  Cuando coma en un restaurante, pida que preparen su comida con menos sal o, en lo posible, sin nada de sal. Qu alimentos se recomiendan? Los alimentos enumerados a continuacin no constituyen Furniture conservator/restorer. Hable con el nutricionista sobre las mejores opciones alimenticias para usted. Cereales Pan de salvado o integral. Pasta de salvado o integral. Arroz integral. Avena. Quinua. Trigo burgol. Cereales integrales y con bajo contenido de sodio. Pan pita. Galletitas de [place] con bajo contenido de [place] y [place]. Tortillas de [place] integral. Verduras Verduras frescas o congeladas (crudas, al vapor, asadas o grilladas). Jugos de tomate y verduras con bajo contenido de sodio o reducidos en sodio. Salsa y pasta de tomate con bajo contenido de sodio o reducidas en sodio. Verduras enlatadas con bajo contenido de sodio o reducidas en sodio. Frutas Todas las frutas frescas, congeladas o disecadas. Frutas enlatadas en jugo natural (sin agregado de azcar). Carne y otros alimentos proteicos Pollo o pavo sin piel. Carne de pollo o de [place]. Cerdo desgrasado. Pescado y [employer]. Claras de huevo. Porotos, guisantes o lentejas secos. Frutos secos, mantequilla de frutos secos y semillas sin sal. Frijoles enlatados sin sal. Cortes de carne vacuna magra, desgrasada. Embutidos magros, con bajo contenido de [place]. Lcteos Leche descremada (1%) o descremada. Quesos sin grasa, con bajo contenido de grasa o descremados. Queso blanco o ricota sin grasa, con bajo contenido de [place]. Yogur semidescremado o descremado. Queso con bajo contenido de [place] y [place]. Grasas y [employer] untables que no contengan grasas trans. Aceite vegetal. Lubertha Basque y aderezos para ensaladas livianos o con bajo contenido de grasas (reducidos  en sodio). Aceite de canola, crtamo, oliva, soja y [place]. [place]. Condimentos y otros alimentos Hierbas. Especias. Mezclas de condimentos sin sal. Palomitas de maz y pretzels sin sal. Dulces con bajo contenido de grasas. Qu alimentos no se recomiendan? Los alimentos enumerados a continuacin no constituyen Furniture conservator/restorer. Hable con el nutricionista sobre las mejores opciones alimenticias para usted. Cereales Productos de panificacin hechos con grasa, como medialunas, magdalenas y algunos panes. Comidas con arroz o pasta seca listas para usar. Verduras Verduras con crema o fritas. Verduras en [place]. Verduras enlatadas regulares (que no sean con bajo contenido de sodio o reducidas en sodio). Pasta y salsa de tomates enlatadas regulares (que no sean con bajo contenido de sodio o reducidas en sodio). Jugos de tomate y verduras regulares (que no sean con bajo contenido de sodio o reducidos en sodio). Pepinillos. Aceitunas. Lambert Mody Fruta enlatada en almbar liviano o espeso. Frutas cocidas en aceite. Frutas con salsa de crema o [place]. Carne y otros alimentos proteicos Cortes de carne con grasa. Costillas. Carne frita. [place]. [place]chichas. [place] y otras carnes procesadas. Salame. Panceta. Perros calientes (hotdogs). [place]. Frutos secos y semillas con sal. Frijoles enlatados con agregado de sal. Pescado enlatado o ahumado. Huevos enteros o yemas. Pollo o pavo con piel. Lcteos Leche entera o al 2%, crema y [place] y  mitad crema. Queso crema entero o con toda su grasa. Yogur entero o endulzado. Quesos con toda su grasa. Sustitutos de cremas no lcteas. Coberturas batidas. Quesos para untar y quesos procesados. Grasas y [employer]. Margarina en barra. [place]. Materia grasa. Mantequilla clarificada. Grasa de panceta. Aceites tropicales como aceite de coco, palmiste o palma. Condimentos y otros alimentos Palomitas de maz y pretzels con sal. Sal de  cebolla, sal de ajo, sal condimentada, sal de mesa y sal marina. Salsa [place]. Salsa trtara. Salsa barbacoa. Salsa teriyaki. Salsa de soja, incluso la que tiene contenido reducido de [place]. Salsa de carne. Salsas en lata y envasadas. Salsa de pescado. Salsa de [place]. Salsa rosada. Rbano picante envasado. [place]. [place]. Saborizantes y tiernizantes para carne. Caldo en cubitos. Salsa picante y salsa tabasco. Escabeches envasados o ya preparados. Aderezos para tacos prefabricados o envasados. Salsas. Aderezos comunes para ensalada. Dnde encontrar ms informacin:  [place], los Pulmones y Herbalist (National Heart, Lung, and [place]): https://wilson-eaton.com/  Asociacin Estadounidense del [place]n (American Heart Association): www.heart.org Resumen  El plan de alimentacin DASH ha demostrado bajar la presin arterial elevada (hipertensin). Tambin puede reducir UnitedHealth de diabetes tipo 2, enfermedad cardaca y accidente cerebrovascular.  Con el plan de alimentacin DASH, deber limitar el consumo de sal (sodio) a 2,300 mg por da. Si tiene hipertensin, es posible que necesite reducir la ingesta de sodio a 1,500 mg por da.  Cuando siga el plan de alimentacin DASH, trate de comer ms frutas frescas y verduras, cereales integrales, carnes magras, lcteos descremados y grasas cardiosaludables.  Trabaje con su mdico o especialista en alimentacin y nutricin (nutricionista) para ajustar su plan alimentario a sus necesidades calricas personales. Esta informacin no tiene Marine scientist el consejo del mdico. Asegrese de hacerle al mdico cualquier pregunta que tenga. Document Revised: 12/04/2016 Document Reviewed: 12/04/2016 Elsevier Patient Education  [place].

## 2020-03-25 ENCOUNTER — Ambulatory Visit: Payer: BC Managed Care – PPO | Admitting: Physician Assistant

## 2020-03-25 DIAGNOSIS — Z01419 Encounter for gynecological examination (general) (routine) without abnormal findings: Secondary | ICD-10-CM | POA: Diagnosis not present

## 2020-03-25 DIAGNOSIS — Z1231 Encounter for screening mammogram for malignant neoplasm of breast: Secondary | ICD-10-CM | POA: Diagnosis not present

## 2020-03-25 DIAGNOSIS — Z6828 Body mass index (BMI) 28.0-28.9, adult: Secondary | ICD-10-CM | POA: Diagnosis not present

## 2020-07-26 ENCOUNTER — Ambulatory Visit: Payer: BC Managed Care – PPO | Admitting: Family Medicine

## 2020-08-05 ENCOUNTER — Ambulatory Visit: Payer: BC Managed Care – PPO | Admitting: Family Medicine

## 2020-09-09 ENCOUNTER — Ambulatory Visit (INDEPENDENT_AMBULATORY_CARE_PROVIDER_SITE_OTHER): Payer: BC Managed Care – PPO | Admitting: Family Medicine

## 2020-09-09 ENCOUNTER — Encounter: Payer: Self-pay | Admitting: Family Medicine

## 2020-09-09 ENCOUNTER — Other Ambulatory Visit: Payer: Self-pay

## 2020-09-09 VITALS — BP 144/90 | HR 88 | Temp 97.6°F | Wt 142.6 lb

## 2020-09-09 DIAGNOSIS — N920 Excessive and frequent menstruation with regular cycle: Secondary | ICD-10-CM

## 2020-09-09 DIAGNOSIS — D500 Iron deficiency anemia secondary to blood loss (chronic): Secondary | ICD-10-CM

## 2020-09-09 DIAGNOSIS — I10 Essential (primary) hypertension: Secondary | ICD-10-CM | POA: Diagnosis not present

## 2020-09-09 DIAGNOSIS — G44209 Tension-type headache, unspecified, not intractable: Secondary | ICD-10-CM

## 2020-09-09 LAB — CBC WITH DIFFERENTIAL/PLATELET
Basophils Absolute: 0 10*3/uL (ref 0.0–0.1)
Basophils Relative: 0.5 % (ref 0.0–3.0)
Eosinophils Absolute: 0.4 10*3/uL (ref 0.0–0.7)
Eosinophils Relative: 6.4 % — ABNORMAL HIGH (ref 0.0–5.0)
HCT: 40.9 % (ref 36.0–46.0)
Hemoglobin: 13.6 g/dL (ref 12.0–15.0)
Lymphocytes Relative: 26.9 % (ref 12.0–46.0)
Lymphs Abs: 1.7 10*3/uL (ref 0.7–4.0)
MCHC: 33.3 g/dL (ref 30.0–36.0)
MCV: 84 fl (ref 78.0–100.0)
Monocytes Absolute: 0.4 10*3/uL (ref 0.1–1.0)
Monocytes Relative: 6.9 % (ref 3.0–12.0)
Neutro Abs: 3.7 10*3/uL (ref 1.4–7.7)
Neutrophils Relative %: 59.3 % (ref 43.0–77.0)
Platelets: 250 10*3/uL (ref 150.0–400.0)
RBC: 4.86 Mil/uL (ref 3.87–5.11)
RDW: 13.7 % (ref 11.5–15.5)
WBC: 6.1 10*3/uL (ref 4.0–10.5)

## 2020-09-09 LAB — BASIC METABOLIC PANEL
BUN: 14 mg/dL (ref 6–23)
CO2: 27 mEq/L (ref 19–32)
Calcium: 8.8 mg/dL (ref 8.4–10.5)
Chloride: 106 mEq/L (ref 96–112)
Creatinine, Ser: 0.62 mg/dL (ref 0.40–1.20)
GFR: 106.86 mL/min (ref >60.00)
Glucose, Bld: 106 mg/dL — ABNORMAL HIGH (ref 70–99)
Potassium: 3.6 mEq/L (ref 3.5–5.1)
Sodium: 137 mEq/L (ref 135–145)

## 2020-09-09 MED ORDER — HYDROCHLOROTHIAZIDE 12.5 MG PO CAPS: 12.5000 mg | ORAL_CAPSULE | Freq: Every day | ORAL | 3 refills | Status: DC

## 2020-09-09 NOTE — Patient Instructions (Signed)
Please return in May 2022 for your annual complete physical; please come fasting and f/u HTN.  Start taking the blood pressure pill daily.  Limit salt in your diet.   If you have any questions or concerns, please don't hesitate to send me a message via MyChart or call the office at [phone]. Thank you for visiting with [place] today! It's our pleasure caring for you.   Hipertensin en los adultos Hypertension, Adult La presin arterial alta (hipertensin) se produce cuando la fuerza de la sangre bombea a travs de las arterias con mucha fuerza. Las arterias son los vasos sanguneos que transportan la sangre desde el corazn al resto del cuerpo. La hipertensin hace que el corazn haga ms esfuerzo para Insurance account manager y [employer] que las arterias se Armed forces training and education officer o Multimedia programmer. La hipertensin no tratada o no controlada puede causar infarto de miocardio, insuficiencia cardaca, accidente cerebrovascular, enfermedad renal y otros problemas. Una lectura de la presin arterial consta de un nmero ms alto sobre un nmero ms bajo. En condiciones ideales, la presin arterial debe estar por debajo de 120/80. El primer nmero ("superior") es la presin sistlica. Es la medida de la presin de las arterias cuando el corazn late. El segundo nmero ("inferior") es la presin diastlica. Es la medida de la presin en las arterias cuando el corazn se relaja. Cules son las causas? Se desconoce la causa exacta de esta afeccin. Hay algunas afecciones que causan presin arterial alta o estn relacionadas con ella. Qu incrementa el riesgo? Algunos factores de riesgo de hipertensin estn bajo su control. Los siguientes factores pueden hacer que sea ms propenso a Aeronautical engineer afeccin:  Fumar.  Tener diabetes mellitus tipo 2, colesterol alto, o ambos.  No hacer la cantidad suficiente de actividad fsica o ejercicio.  Tener sobrepeso.  Consumir mucha grasa, azcar, caloras o sal (sodio) en su  dieta.  Beber alcohol en exceso. Algunos factores de riesgo para la presin arterial alta pueden ser difciles o imposibles de Multimedia programmer. Algunos de estos factores son los siguientes:  Tener enfermedad renal crnica.  Tener antecedentes familiares de presin arterial alta.  Edad. Los riesgos aumentan con la edad.  Raza. El riesgo es mayor para las Statistician.  Sexo. Antes de los 45aos, los hombres corren ms [employer]. Despus de los 65aos, las mujeres corren ms [employer].  Tener apnea obstructiva del sueo.  Estrs. Cules son los signos o los sntomas? Es posible que la presin arterial alta puede no cause sntomas. La presin arterial muy alta (crisis hipertensiva) puede provocar:  Dolor de cabeza.  Ansiedad.  Falta de aire.  Hemorragia nasal.  Nuseas y vmitos.  Cambios en la visin.  Dolor de pecho intenso.  Convulsiones. Cmo se diagnostica? Esta afeccin se diagnostica al medir su presin arterial mientras se encuentra sentado, con el brazo apoyado sobre una superficie plana, las piernas sin cruzar y los pies bien apoyados en el piso. El brazalete del tensimetro debe colocarse directamente sobre la piel de la parte superior del brazo y al nivel de su corazn. Debe medirla al [facility] veces en el mismo brazo. Determinadas condiciones pueden causar una diferencia de presin arterial entre el brazo izquierdo y Aeronautical engineer. Ciertos factores pueden provocar que las lecturas de la presin arterial sean inferiores o superiores a lo normal por un perodo corto de tiempo:  Si su presin arterial es ms alta cuando se encuentra en el consultorio del mdico que cuando la mide en su  hogar, se denomina "hipertensin de bata blanca". La Harley[employer]son de las personas que tienen esta afeccin no deben ser [employer].  Si su presin arterial es ms alta en el hogar que cuando se encuentra en el consultorio del mdico, se denomina "hipertensin  enmascarada". La Harley[employer]son de las personas que tienen esta afeccin deben ser medicadas para Chief Operating Officer la presin arterial. Si tiene una lecturas de presin arterial alta durante una visita o si tiene presin arterial normal con otros factores de riesgo, se le podr pedir que haga lo siguiente:  Que regrese otro da para volver a Chief Operating Officer su presin arterial nuevamente.  Que se controle la presin arterial en su casa durante 1 semana o ms. Si se le diagnostica hipertensin, es posible que se le realicen otros anlisis de sangre o estudios de diagnstico por imgenes para ayudar a su mdico a comprender su riesgo general de tener otras afecciones. Cmo se trata? Esta afeccin se trata haciendo cambios saludables en el estilo de vida, tales como ingerir alimentos saludables, realizar ms ejercicio y reducir el consumo de alcohol. El mdico puede recetarle medicamentos si los cambios en el estilo de vida no son suficientes para Museum/gallery curator la presin arterial y si:  Su presin arterial sistlica est por encima de 130.  Su presin arterial diastlica est por encima de 80. La presin arterial deseada puede variar en funcin de las enfermedades, la edad y otros factores personales. Siga estas instrucciones en su casa: Comida y bebida  Siga una dieta con alto contenido de fibras y [place], y con bajo contenido de sodio, International aid/development worker agregada y Neurosurgeon. Un ejemplo de plan alimenticio es la dieta DASH (Dietary Approaches to Stop Hypertension, Mtodos alimenticios para detener la hipertensin). Para alimentarse de esta manera: ? Coma mucha fruta y verdura fresca. Trate de que la mitad del plato de cada comida sea de frutas y verduras. ? Coma cereales integrales, como pasta integral, arroz integral o pan integral. Llene aproximadamente un cuarto del plato con cereales integrales. ? Coma y beba productos lcteos con bajo contenido de grasa, como leche descremada o yogur bajo en grasas. ? Evite la ingesta  de cortes de carne grasa, carne procesada o curada, y carne de ave con piel. Llene aproximadamente un cuarto del plato con protenas magras, como pescado, pollo sin piel, frijoles, huevos o tofu. ? Evite ingerir alimentos prehechos y procesados. En general, estos tienen mayor cantidad de sodio, azcar agregada y Steffanie [place].  Reduzca su ingesta diaria de sodio. La mayora de las personas que tienen hipertensin deben comer menos de 1500 mg de sodio por [employer].  No beba alcohol si: ? Su mdico le indica no hacerlo. ? Est embarazada, puede estar embarazada o est tratando de quedar embarazada.  Si bebe alcohol: ? Limite la cantidad que bebe a lo siguiente:  De 0 a 1 medida por da para las mujeres.  De 0 a 2 medidas por da para los hombres. ? Est atento a la cantidad de alcohol que hay en las bebidas que toma. En los [place], una medida equivale a una botella de cerveza de 12oz ( ), un vaso de vino de 5oz ( ) o un vaso de una bebida alcohlica de alta graduacin de 1oz (80ml).   Estilo de vida  Trabaje con su mdico para mantener un peso saludable o Curator. Pregntele cul es el peso recomendado para usted.  Haga al menos de ejercicio la [employer]V de la [place]. Estas actividades pueden incluir caminar, nadar  o andar en bicicleta.  Incluya ejercicios para fortalecer sus msculos (ejercicios de resistencia), como [place] o levantamiento de pesas, como parte de su rutina semanal de ejercicios. Intente realizar 53minutos de este tipo de ejercicios al [employer] a la [place].  No consuma ningn producto que contenga nicotina o tabaco, como cigarrillos, cigarrillos electrnicos y tabaco de Higher education careers adviser. Si necesita ayuda para dejar de fumar, consulte al mdico.  Contrlese la presin arterial en su casa segn las indicaciones del mdico.  Concurra a todas las visitas de seguimiento como se lo haya indicado el mdico. Esto es importante.    Medicamentos  [employer] de venta libre y los recetados solamente como se lo haya indicado el mdico. Siga cuidadosamente las indicaciones. Los medicamentos para la presin arterial deben tomarse segn las indicaciones.  No omita las dosis de medicamentos para la presin arterial. Si lo hace, estar en riesgo de tener problemas y puede hacer que los medicamentos sean menos eficaces.  Pregntele a su mdico a qu efectos secundarios o reacciones a los Careers information officer. Comunquese con un mdico si:  Piensa que tiene una reaccin a un medicamento que est tomando.  Tiene dolores de cabeza frecuentes (recurrentes).  Se siente mareado.  Tiene hinchazn en los tobillos.  Tiene problemas de visin. Solicite ayuda inmediatamente si:  Siente un dolor de cabeza intenso o confusin.  Siente debilidad inusual o adormecimiento.  Siente que va a desmayarse.  Siente un dolor intenso en el pecho o el abdomen.  Vomita repetidas veces.  Tiene dificultad para respirar. Resumen  La hipertensin se produce cuando la sangre bombea en las arterias con mucha fuerza. Si esta afeccin no se controla, podra correr riesgo de tener complicaciones graves.  La presin arterial deseada puede variar en funcin de las enfermedades, la edad y otros factores personales. Para la [employer], una presin arterial normal es menor que 120/80.  La hipertensin se trata con cambios en el estilo de vida, medicamentos o una combinacin de English. Los McDonald[employer] Corporation estilo de vida incluyen prdida de peso, ingerir alimentos sanos, seguir una dieta baja en sodio, hacer ms ejercicio y Environmental consultant consumo de alcohol. Esta informacin no tiene Marine scientist el consejo del mdico. Asegrese de hacerle al mdico cualquier pregunta que tenga. Document Revised: 05/30/2018 Document Reviewed: 05/30/2018 Elsevier Patient Education  2021 [place] tensional en los  adultos Tension Headache, Adult La cefalea tensional es una sensacin de dolor o presin en la frente y los lados de la cabeza. El dolor puede ser sordo o puede sentirse como una tensin. Hay dos tipos de cefaleas tensionales:  Cefalea tensional episdica. Es cuando las cefaleas se producen menos de 15das por mes.  Cefalea tensional crnica. Es cuando las cefaleas se producen ms de 15das por mes en un perodo de 36meses. Las cefaleas tensionales pueden durar de 64minutos a varios das. Constituyen el tipo ms frecuente de dolor de [place]. Generalmente, no se asocian con nuseas o vmitos y no empeoran con la actividad fsica. Cules son las causas? Se desconoce la causa exacta de esta afeccin. Las cefaleas tensionales generalmente aparecen despus de una situacin de estrs, ansiedad o por depresin. Otros factores desencadenantes pueden incluir los siguientes:  Alcohol.  Exceso de cafena o abstinencia de cafena.  Infecciones respiratorias, como resfriados, gripes o sinusitis.  Problemas dentales o apretar los dientes.  Fatiga.  Mantener la cabeza y el cuello en la misma posicin durante un  perodo prolongado, por ejemplo, al [employer].  Fumar.  Artritis del cuello. Cules son los signos o los sntomas? Los sntomas de esta afeccin incluyen:  Sensacin de presin o tensin alrededor de la cabeza.  Dolor sordo en la cabeza.  Dolor sobre la frente y los lados de la cabeza.  Dolor a la [employer] de la cabeza, del cuello y de los hombros. Cmo se diagnostica? Esta afeccin se puede diagnosticar en funcin de los sntomas, la historia clnica y los antecedentes mdicos. Si los sntomas son agudos o inusuales, es posible que le hagan estudios de diagnstico por imgenes, como una exploracin por tomografa computarizada (TC) o una resonancia magntica (RM) de la cabeza. Tambin le pueden revisar la vista. Cmo se trata? Esta afeccin puede  tratarse con cambios en el estilo de vida y medicamentos que ayudan a Public house manager los sntomas. Siga estas instrucciones en su casa: Control del dolor  Use los medicamentos de venta libre y los recetados solamente como se lo haya indicado el mdico.  Cuando tenga una cefalea, acustese en una habitacin oscura y silenciosa.  Si se lo indican, aplquese hielo en la cabeza y en el cuello. Para hacer esto: ? Ponga el hielo en una bolsa plstica. ? Coloque una [employer] piel y Therapist, nutritional. ? Aplique el hielo durante 24minutos, 2 o 3veces por da. ? Retire el hielo si la piel se pone de color rojo brillante. Esto es [employer]. Si no puede sentir dolor, calor o fro, tiene un mayor riesgo de que se dae la zona.  Si se lo indican, aplique calor en la zona posterior del cuello con la frecuencia que le haya indicado el mdico. Use la fuente de calor que el mdico le recomiende, como una compresa de calor hmedo o una almohadilla trmica. ? Coloque una [employer] piel y la fuente de Freight forwarder. ? Aplique calor durante 20 a 70minutos. ? Retire la fuente de calor si la piel se pone de color rojo brillante. Esto es especialmente importante si no puede sentir dolor, calor o fro. Corre un mayor riesgo de sufrir quemaduras. Comida y bebida  Mantenga un horario para las comidas.  Si bebe alcohol: ? Limite la cantidad que bebe a lo siguiente:  De 0 a 1 medida por da para las mujeres que no estn embarazadas.  De 0 a 2 medidas por da para los hombres. ? Sepa cunta cantidad de alcohol hay en las bebidas que toma. En los [place], una medida equivale a una botella de cerveza de 12oz (334ml), un vaso de vino de 5oz (139ml) o un vaso de una bebida alcohlica de alta graduacin de 1oz (29ml).  Beba suficiente lquido como para Theatre manager la orina de color amarillo plido.  Disminuya el consumo de cafena o deje de consumir cafena. Estilo de vida  Duerma entre 7 y 9horas o la  cantidad de horas que le haya recomendado el mdico.  A la hora de [place], retire las computadoras, los telfonos y las tabletas del dormitorio.  Busque maneras de [employer]. Esto puede incluir: ? Energy manager. ? Practicar ejercicios de respiracin profunda. ? Yoga. ? Conservation officer, nature. ? Visualizacin mental positiva.  Trate de sentarse derecho y [employer] Disney.  No consuma ningn producto que contenga nicotina o tabaco. Estos incluyen cigarrillos, tabaco para mascar y aparatos de vapeo, como los Psychologist, sport and exercise. Si necesita ayuda para dejar de fumar, consulte al mdico. Instrucciones generales  Evite cualquier desencadenante del dolor de cabeza. Lleve un registro diario para Tax inspector las cefaleas. Registre, por ejemplo, lo siguiente: ? Lo que usted come y bebe. ? El tiempo que duerme. ? Algn cambio en su dieta o en los medicamentos.  Cumpla con todas las visitas de seguimiento. Esto es importante.   Comunquese con un mdico si:  La cefalea no se alivia.  La cefalea regresa.  Tiene sensibilidad a los sonidos, la luz o los olores debido a Scientific laboratory technician.  Tiene nuseas o vmitos.  Le duele el estmago. Solicite ayuda de inmediato si:  Tiene una cefalea muy intensa y repentina junto con alguno de los siguientes sntomas: ? Rigidez en el cuello. ? Nuseas y vmitos. ? Confusin. ? Debilidad en una parte o un lado del cuerpo. ? Visin doble o prdida de la visin. ? Falta de aire. ? Erupcin cutnea. ? Somnolencia inusual. ? Cristy Hilts o escalofros. ? Dificultad para hablar. ? Dolor en el ojo o el odo. ? Dificultad para caminar o mantener el equilibrio. ? Mareo o desmayo. Resumen  La cefalea tensional es una sensacin de dolor o presin en la frente y los lados de la cabeza.  Las cefaleas tensionales pueden durar de 48minutos a varios das. Constituyen el tipo ms frecuente de dolor de [place].  Esta  afeccin se puede diagnosticar en funcin de los sntomas, la historia clnica y los antecedentes mdicos.  Esta afeccin puede tratarse con cambios en el estilo de vida y medicamentos que ayudan a Public house manager los sntomas. Esta informacin no tiene Marine scientist el consejo del mdico. Asegrese de hacerle al mdico cualquier pregunta que tenga. Document Revised: 06/22/2020 Document Reviewed: 06/22/2020 Elsevier Patient Education  2021 [employer].

## 2020-09-09 NOTE — Progress Notes (Signed)
Subjective  CC:  Chief Complaint  Patient presents with  . Hypertension    Does periodically check BP at home, usually around 140/80's     HPI: Martha Blankenship is a 45 y.o. female who presents to the office today to address the problems listed above in the chief complaint.  Blood pressure f/u: 45 year old with borderline blood pressure readings here for follow-up.  Home readings remain borderline elevated.  Here in the office it is elevated again.  She feels well.  Weight is stable.  Typical 18 on diet.  No chest pain or shortness of breath.  No lower extremity edema  Complains of frontal bilateral headaches on and off for the last 6 months to a year.  She wonders if it is related to blood pressure.  She denies visual changes, severe headaches, nausea vomiting or associated photophobia.  She admits to high stress that tend to trigger headaches.  Headaches are relieved by Tylenol.  She takes this intermittently.  No history of migraines.  Menorrhagia with iron deficiency: She is taking iron daily at this point.  Has been doing so for the last 3 months.  Has been taking more frequently.  Denies constipation or abdominal pain.  Menstrual cycles continue to be regular fairly heavy.  No symptoms of fatigue or anemia.   Assessment  1. Essential hypertension   2. Tension headache   3. Menorrhagia with regular cycle   4. Iron deficiency anemia due to chronic blood loss      Plan    Hypertension, new diagnosis: Educated on diagnosis and prognosis.  Discussed diet changes, weight loss and initiation of antihypertensives.  Start HCTZ 12.5 mg daily.  See after visit summary for more information.  Tension headaches: Education given.  Intermittent Tylenol is fine.  Discussed ways to manage stress.  Relaxation techniques and breathing exercises discussed.  Patient denies symptoms of panic or chronic anxiety.  Monitor for now.  No red flag symptoms noted.  Menorrhagia with history of iron  deficiency anemia currently on daily supplemental iron: Recheck iron studies today.  Further education given.   Education regarding management of these chronic disease states was given. Management strategies discussed on successive visits include dietary and exercise recommendations, goals of achieving and maintaining IBW, and lifestyle modifications aiming for adequate sleep and minimizing stressors.   Follow up: Return in about 4 months (around 01/07/2021) for complete physical, follow up Hypertension.  Orders Placed This Encounter  Procedures  . Basic metabolic panel  . CBC with Differential/Platelet  . Iron, TIBC and Ferritin Panel   Meds ordered this encounter  Medications  . hydrochlorothiazide (MICROZIDE) 12.5 MG capsule    Sig: Take 1 capsule (12.5 mg total) by mouth daily.    Dispense:  90 capsule    Refill:  3      BP Readings from Last 3 Encounters:  09/09/20 (!) 144/90  01/22/20 138/88  01/01/20 (!) 140/98   Wt Readings from Last 3 Encounters:  09/09/20 142 lb 9.6 oz (64.7 kg)  01/22/20 144 lb 9.6 oz (65.6 kg)  01/01/20 147 lb 9.6 oz (67 kg)    Lab Results  Component Value Date   CHOL 172 01/01/2020   Lab Results  Component Value Date   HDL 32.10 (L) 01/01/2020   Lab Results  Component Value Date   LDLCALC 104 (H) 01/01/2020   Lab Results  Component Value Date   TRIG 181.0 (H) 01/01/2020   Lab Results  Component Value Date  CHOLHDL 5 01/01/2020   No results found for: LDLDIRECT Lab Results  Component Value Date   CREATININE 0.59 01/01/2020   BUN 14 01/01/2020   NA 138 01/01/2020   K 4.0 01/01/2020   CL 103 01/01/2020   CO2 28 01/01/2020    The 10-year ASCVD risk score Mikey Bussing DC Jr., et al., 2013) is: 2.7%   Values used to calculate the score:     Age: 49 years     Sex: Female     Is Non-Hispanic African American: No     Diabetic: No     Tobacco smoker: No     Systolic Blood Pressure: 297 mmHg     Is BP treated: Yes     HDL  Cholesterol: 32.1 mg/dL     Total Cholesterol: 172 mg/dL  I reviewed the patients updated PMH, FH, and SocHx.    Patient Active Problem List   Diagnosis Date Noted  . GERD (gastroesophageal reflux disease) 07/02/2019  . Menorrhagia with regular cycle 07/02/2019  . Iron deficiency anemia due to chronic blood loss 07/02/2019  . Bloating 07/02/2019  . 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  Social History: Patient  reports that she has never smoked. She has never used smokeless tobacco. She reports previous alcohol use. She reports that she does not use drugs.  Current Meds  Medication Sig  . diclofenac (VOLTAREN) 75 MG EC tablet Take 1 tablet (75 mg total) by mouth 2 (two) times daily.  . ferrous sulfate 325 (65 FE) MG tablet Take 325 mg by mouth 2 (two) times daily with a meal.   . hydrochlorothiazide (MICROZIDE) 12.5 MG capsule Take 1 capsule (12.5 mg total) by mouth daily.  Marland Kitchen omeprazole (PRILOSEC) 40 MG capsule Take 1 capsule by mouth daily.  . tranexamic acid (LYSTEDA) 650 MG TABS tablet Take 1,300 mg by mouth 3 (three) times daily.    Review of Systems: Cardiovascular: negative for chest pain, palpitations, leg swelling, orthopnea Respiratory: negative for SOB, wheezing or persistent cough Gastrointestinal: negative for abdominal pain Genitourinary: negative for dysuria or gross hematuria  Objective  Vitals: BP (!) 144/90   Pulse 88   Temp 97.6 F (36.4 C) (Temporal)   Wt 142 lb 9.6 oz (64.7 kg)   SpO2 97%   BMI 27.85 kg/m  General: no acute distress  Psych:  Alert and oriented, normal mood and affect HEENT:  Normocephalic, atraumatic, supple neck  Cardiovascular:  RRR without murmur. no edema Respiratory:  Good breath sounds bilaterally, CTAB with normal respiratory effort   Commons side effects, risks, benefits, and alternatives for medications and treatment plan prescribed today were discussed, and the patient  expressed understanding of the given instructions. Patient is instructed to call or message via MyChart if he/she has any questions or concerns regarding our treatment plan. No barriers to understanding were identified. We discussed Red Flag symptoms and signs in detail. Patient expressed understanding regarding what to do in case of urgent or emergency type symptoms.   Medication list was reconciled, printed and provided to the patient in AVS. Patient instructions and summary information was reviewed with the patient as documented in the AVS. This note was prepared with assistance of Dragon voice recognition software. Occasional wrong-word or sound-a-like substitutions may have occurred due to the inherent limitations of voice recognition software  This visit occurred during the SARS-CoV-2 public health emergency.  Safety protocols were in place, including screening questions prior  to the visit, additional usage of staff PPE, and extensive cleaning of exam room while observing appropriate contact time as indicated for disinfecting solutions.

## 2020-09-10 LAB — IRON,TIBC AND FERRITIN PANEL
%SAT: 17 % (calc) (ref 16–45)
Ferritin: 13 ng/mL — ABNORMAL LOW (ref 16–232)
Iron: 61 ug/dL (ref 40–190)
TIBC: 364 mcg/dL (calc) (ref 250–450)

## 2020-09-14 NOTE — Progress Notes (Signed)
Please call patient: I have reviewed his/her lab results. Labs are stable, please continue iron supplements once or twice daily until she is no longer having menstrual cycles. She is no longer anemic. Thanks .

## 2021-01-10 ENCOUNTER — Encounter: Payer: BC Managed Care – PPO | Admitting: Family Medicine

## 2021-03-28 ENCOUNTER — Ambulatory Visit (INDEPENDENT_AMBULATORY_CARE_PROVIDER_SITE_OTHER): Payer: BC Managed Care – PPO | Admitting: Family Medicine

## 2021-03-28 ENCOUNTER — Other Ambulatory Visit (HOSPITAL_COMMUNITY)
Admission: RE | Admit: 2021-03-28 | Discharge: 2021-03-28 | Disposition: A | Discharge status: Home / Self Care | Payer: BC Managed Care – PPO | Source: Ambulatory Visit | Attending: Family Medicine | Admitting: Family Medicine

## 2021-03-28 ENCOUNTER — Other Ambulatory Visit: Payer: Self-pay

## 2021-03-28 ENCOUNTER — Encounter: Payer: Self-pay | Admitting: Family Medicine

## 2021-03-28 VITALS — BP 122/88 | HR 96 | Temp 98.1°F | Resp 16 | Ht 60.0 in | Wt 142.8 lb

## 2021-03-28 DIAGNOSIS — N920 Excessive and frequent menstruation with regular cycle: Secondary | ICD-10-CM | POA: Diagnosis not present

## 2021-03-28 DIAGNOSIS — Z1211 Encounter for screening for malignant neoplasm of colon: Secondary | ICD-10-CM

## 2021-03-28 DIAGNOSIS — Z0000 Encounter for general adult medical examination without abnormal findings: Secondary | ICD-10-CM | POA: Diagnosis not present

## 2021-03-28 DIAGNOSIS — K219 Gastro-esophageal reflux disease without esophagitis: Secondary | ICD-10-CM | POA: Diagnosis not present

## 2021-03-28 DIAGNOSIS — Z124 Encounter for screening for malignant neoplasm of cervix: Secondary | ICD-10-CM | POA: Diagnosis not present

## 2021-03-28 DIAGNOSIS — Z1212 Encounter for screening for malignant neoplasm of rectum: Secondary | ICD-10-CM | POA: Diagnosis not present

## 2021-03-28 DIAGNOSIS — D500 Iron deficiency anemia secondary to blood loss (chronic): Secondary | ICD-10-CM

## 2021-03-28 DIAGNOSIS — I10 Essential (primary) hypertension: Secondary | ICD-10-CM | POA: Diagnosis not present

## 2021-03-28 LAB — LIPID PANEL
Cholesterol: 168 mg/dL (ref 0–200)
HDL: 33.5 mg/dL — ABNORMAL LOW (ref >39.00)
LDL Cholesterol: 101 mg/dL — ABNORMAL HIGH (ref 0–99)
NonHDL: 134.75
Total CHOL/HDL Ratio: 5
Triglycerides: 168 mg/dL — ABNORMAL HIGH (ref 0.0–149.0)
VLDL: 33.6 mg/dL (ref 0.0–40.0)

## 2021-03-28 LAB — CBC WITH DIFFERENTIAL/PLATELET
Basophils Absolute: 0 10*3/uL (ref 0.0–0.1)
Basophils Relative: 0.7 % (ref 0.0–3.0)
Eosinophils Absolute: 0.5 10*3/uL (ref 0.0–0.7)
Eosinophils Relative: 7.4 % — ABNORMAL HIGH (ref 0.0–5.0)
HCT: 39.1 % (ref 36.0–46.0)
Hemoglobin: 12.9 g/dL (ref 12.0–15.0)
Lymphocytes Relative: 26.4 % (ref 12.0–46.0)
Lymphs Abs: 1.9 10*3/uL (ref 0.7–4.0)
MCHC: 32.9 g/dL (ref 30.0–36.0)
MCV: 84.6 fl (ref 78.0–100.0)
Monocytes Absolute: 0.6 10*3/uL (ref 0.1–1.0)
Monocytes Relative: 8 % (ref 3.0–12.0)
Neutro Abs: 4.2 10*3/uL (ref 1.4–7.7)
Neutrophils Relative %: 57.5 % (ref 43.0–77.0)
Platelets: 265 10*3/uL (ref 150.0–400.0)
RBC: 4.62 Mil/uL (ref 3.87–5.11)
RDW: 12.8 % (ref 11.5–15.5)
WBC: 7.3 10*3/uL (ref 4.0–10.5)

## 2021-03-28 LAB — COMPREHENSIVE METABOLIC PANEL
ALT: 16 U/L (ref 0–35)
AST: 17 U/L (ref 0–37)
Albumin: 4 g/dL (ref 3.5–5.2)
Alkaline Phosphatase: 53 U/L (ref 39–117)
BUN: 17 mg/dL (ref 6–23)
CO2: 27 mEq/L (ref 19–32)
Calcium: 9.5 mg/dL (ref 8.4–10.5)
Chloride: 103 mEq/L (ref 96–112)
Creatinine, Ser: 0.62 mg/dL (ref 0.40–1.20)
GFR: 106.45 mL/min (ref >60.00)
Glucose, Bld: 91 mg/dL (ref 70–99)
Potassium: 3.7 mEq/L (ref 3.5–5.1)
Sodium: 137 mEq/L (ref 135–145)
Total Bilirubin: 0.6 mg/dL (ref 0.2–1.2)
Total Protein: 7.3 g/dL (ref 6.0–8.3)

## 2021-03-28 LAB — HEMOGLOBIN A1C: Hgb A1c MFr Bld: 5.7 % (ref 4.6–6.5)

## 2021-03-28 MED ORDER — OMEPRAZOLE 20 MG PO CPDR: 20.0000 mg | DELAYED_RELEASE_CAPSULE | Freq: Every day | ORAL | 2 refills | Status: DC | PRN

## 2021-03-28 NOTE — Progress Notes (Signed)
Subjective  Chief Complaint  Patient presents with   Annual Exam    Fasting  Needing referral for colonoscopy    Gynecologic Exam    Pap today Mammogram scheduled for the end of the month    HPI: Martha Blankenship is a 45 y.o. female who presents to [facility] today for a Female Wellness Visit. She also has the concerns and/or needs as listed above in the chief complaint. These will be addressed in addition to the Health Maintenance Visit.   Wellness Visit: annual visit with health maintenance review and exam with Pap  45 year old female, married, regular cycles, no birth control except colitis interruptus presents for physical.  Overall doing very well.  Eligible for colorectal cancer screening.  Pap smear due.  Has mammogram scheduled for August.  Immunizations are up-to-date although she is eligible for COVID booster.  She is a Secretary/administrator, she works for herself.  She works almost 7 days a week.  She is happy Chronic disease f/u and/or acute problem visit: (deemed necessary to be done in addition to the wellness visit): Hypertension: Patient is doing very well on low-dose hydrochlorothiazide daily.  No adverse effects.  Does not check blood pressure at home.  No symptoms of chest pain, shortness of breath or lower extremity edema.  Compliant with medication is good. History of menorrhagia with regular cycle and iron deficiency anemia.  She continues on iron daily.  Feels well.  Cycles are regular and less heavy.  No dysmenorrhea. GERD: This is much better.  She is no longer needing PPI daily.  She uses it rarely and intermittently.  It works well when she does take the medication.  She does need a refill.  No blood in the stool.  No hematemesis.  Assessment  1. Annual physical exam   2. Cervical cancer screening   3. Screening for colorectal cancer   4. Essential hypertension   5. Menorrhagia with regular cycle   6. Iron deficiency anemia due to chronic blood  loss   7. Gastroesophageal reflux disease without esophagitis      Plan  Female Wellness Visit: Age appropriate Health Maintenance and Prevention measures were discussed with patient. Included topics are cancer screening recommendations, ways to keep healthy (see AVS) including dietary and exercise recommendations, regular eye and dental care, use of seat belts, and avoidance of moderate alcohol use and tobacco use.  Pap smear done today with HPV cotesting.  Mammogram scheduled.  Discussed colorectal cancer screenings options: Patient elects colonoscopy.  Referral placed.  She is average risk. BMI: discussed patient's BMI and encouraged positive lifestyle modifications to help get to or maintain a target BMI. HM needs and immunizations were addressed and ordered. See below for orders. See HM and immunization section for updates.  Up-to-date Routine labs and screening tests ordered including cmp, cbc and lipids where appropriate. Discussed recommendations regarding Vit D and calcium supplementation (see AVS)  Chronic disease management visit and/or acute problem visit: Hypertension: Well-controlled.  Continue hydrochlorothiazide 25 mg daily.  Recheck 6 months.  Check renal function electrolytes and lipids GERD: Well-controlled.  Rare symptoms.  PPI refilled for as needed use Iron deficiency anemia due to heavy cycles: Recheck today to ensure stable.  Has been reversed.  Check iron levels and may be able to stop daily iron. Discussed birth control.  Patient and her husband are comfortable with colitis interruptus.  Follow up: Return in about 6 months (around 09/28/2021) for follow up Hypertension.  Orders Placed This Encounter  Procedures   CBC with Differential/Platelet   Comprehensive metabolic panel   Hepatitis C antibody   Iron, TIBC and Ferritin Panel   Lipid panel   Hemoglobin A1c   Ambulatory referral to Gastroenterology   Meds ordered this encounter  Medications   omeprazole  (PRILOSEC) 20 MG capsule    Sig: Take 1 capsule (20 mg total) by mouth daily as needed.    Dispense:  30 capsule    Refill:  2      Body mass index is 27.89 kg/m. 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    Health Maintenance  Topic Date Due   Hepatitis C Screening  Never done   COLONOSCOPY (Pts 45-87yr Insurance coverage will need to be confirmed)  Never done   MAMMOGRAM  03/05/2021   PAP SMEAR-Modifier  02/07/2021   COVID-19 Vaccine (3 - Booster for Janssen series) 04/13/2021 (Originally 09/18/2020)   Pneumococcal Vaccine 048619Years old (1 - PCV) 03/28/2022 (Originally [birth year]1980)   TETANUS/TDAP  12/31/2029   HIV Screening  Completed   HPV VACCINES  Aged Out   INFLUENZA VACCINE  Discontinued   Immunization History  Administered Date(s) Administered   Janssen (J&J) SARS-COV-2 Vaccination 11/18/2019   PFIZER(Purple Top)SARS-COV-2 Vaccination 07/24/2020   Tdap 01/01/2020   We updated and reviewed the patient's past history in detail and it is documented below. Allergies: Patient has No Known Allergies. 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 Past Surgical History Patient  has a past surgical history that includes Cesarean section and Breast biopsy (Right, 12/2014). Family History: Patient family history includes Alzheimer's disease in her maternal grandmother; Bone cancer in her maternal grandmother; Diabetes in her mother; Healthy  in her son; Hypertension in her father and paternal grandfather. Social History:  Patient  reports that she has never smoked. She has never used smokeless tobacco. She reports previous alcohol use. She reports that she does not use drugs.  Review of Systems: Constitutional: negative for fever or malaise Ophthalmic: negative for photophobia, double vision or loss of vision Cardiovascular: negative for chest pain, dyspnea on exertion, or new LE swelling Respiratory: negative for SOB or persistent cough Gastrointestinal: negative for abdominal pain, change in bowel habits or melena Genitourinary: negative for dysuria or gross hematuria, no abnormal uterine bleeding or disharge Musculoskeletal: negative for new gait disturbance or muscular weakness Integumentary: negative for new or persistent rashes, no breast lumps Neurological: negative for TIA or stroke symptoms Psychiatric: negative for SI or delusions Allergic/Immunologic: negative for hives  Patient Care Team    Relationship Specialty Notifications Start End  ALeamon Arnt MD PCP - General Family Medicine  07/02/19   CJerelyn Charles MD Consulting Physician Obstetrics  07/02/19   MJuanita Craver MD Consulting Physician Gastroenterology  07/02/19     Objective  Vitals: BP 122/88   Pulse 96   Temp 98.1 F (36.7 C) (Temporal)   Resp 16  Ht 5' (1.524 m)   Wt 142 lb 12.8 oz (64.8 kg)   LMP 03/04/2021   SpO2 97%   BMI 27.89 kg/m  General:  Well developed, well nourished, no acute distress  Psych:  Alert and orientedx3,normal mood and affect HEENT:  Normocephalic, atraumatic, non-icteric sclera,  supple neck without adenopathy, mass or thyromegaly Cardiovascular:  Normal S1, S2, RRR without gallop, rub or murmur Respiratory:  Good breath sounds bilaterally, CTAB with normal respiratory effort Gastrointestinal: normal bowel sounds, soft, non-tender, no noted masses. No HSM MSK: no deformities, contusions. Joints are without erythema  or swelling.  Skin:  Warm, no rashes or suspicious lesions noted Neurologic:    Mental status is normal. CN 2-11 are normal. Gross motor and sensory exams are normal. Normal gait. No tremor Breast Exam: No mass, skin retraction or nipple discharge is appreciated in either breast. No axillary adenopathy. Fibrocystic changes are not noted Pelvic Exam: Normal external genitalia, no vulvar or vaginal lesions present. Clear cervix w/o CMT. Bimanual exam reveals a nontender fundus w/o masses, nl size. No adnexal masses present. No inguinal adenopathy. A PAP smear was performed.   Commons side effects, risks, benefits, and alternatives for medications and treatment plan prescribed today were discussed, and the patient expressed understanding of the given instructions. Patient is instructed to call or message via MyChart if he/she has any questions or concerns regarding our treatment plan. No barriers to understanding were identified. We discussed Red Flag symptoms and signs in detail. Patient expressed understanding regarding what to do in case of urgent or emergency type symptoms.  Medication list was reconciled, printed and provided to the patient in AVS. Patient instructions and summary information was reviewed with the patient as documented in the AVS. This note was prepared with assistance of Dragon voice recognition software. Occasional wrong-word or sound-a-like substitutions may have occurred due to the inherent limitations of voice recognition software  This visit occurred during the SARS-CoV-2 public health emergency.  Safety protocols were in place, including screening questions prior to the visit, additional usage of staff PPE, and extensive cleaning of exam room while observing appropriate contact time as indicated for disinfecting solutions.

## 2021-03-28 NOTE — Patient Instructions (Signed)
Please return in 6 months for hypertension follow up.   I will release your lab results to you on your MyChart account with further instructions. Please reply with any questions.    We will call you with information regarding your referral appointment. [facility] for colonoscopy evaluation.  If you do not hear from Martha Blankenship within the next 2 weeks, please let me know. It can take 1-2 weeks to get appointments set up with the specialists.    If you have any questions or concerns, please don't hesitate to send me a message via MyChart or call the office at [phone]. Thank you for visiting with Martha Blankenship today! It's our pleasure caring for you.   Please do these things to maintain good health!  Exercise at least 30-45 minutes a day,  4-5 days a week.  Eat a low-fat diet with lots of fruits and vegetables, up to 7-9 servings per day. Drink plenty of water daily. Try to drink 8 8oz glasses per day. Seatbelts can save your life. Always wear your seatbelt. Place Smoke Detectors on every level of your home and check batteries every year. Schedule an appointment with an eye doctor for an eye exam every 1-2 years Safe sex - use condoms to protect yourself from STDs if you could be exposed to these types of infections. Use birth control if you do not want to become pregnant and are sexually active. Avoid heavy alcohol use. If you drink, keep it to less than 2 drinks/day and not every day. [place].  Choose someone you trust that could speak for you if you became unable to speak for yourself. Depression is common in our stressful world.If you're feeling down or losing interest in things you normally enjoy, please come in for a visit. If anyone is threatening or hurting you, please get help. Physical or Emotional Violence is never OK.

## 2021-03-29 LAB — IRON,TIBC AND FERRITIN PANEL
%SAT: 9 % (calc) — ABNORMAL LOW (ref 16–45)
Ferritin: 5 ng/mL — ABNORMAL LOW (ref 16–232)
Iron: 40 ug/dL (ref 40–190)
TIBC: 440 mcg/dL (calc) (ref 250–450)

## 2021-03-29 LAB — HEPATITIS C ANTIBODY
Hepatitis C Ab: NONREACTIVE
SIGNAL TO CUT-OFF: 0.01 (ref <1.00)

## 2021-03-30 ENCOUNTER — Other Ambulatory Visit: Payer: Self-pay

## 2021-03-30 LAB — CYTOLOGY - PAP
Comment: NEGATIVE
Diagnosis: NEGATIVE
High risk HPV: NEGATIVE

## 2021-03-30 MED ORDER — FERROUS SULFATE 325 (65 FE) MG PO TABS: 325.0000 mg | ORAL_TABLET | Freq: Three times a day (TID) | ORAL | 0 refills | Status: DC

## 2021-03-31 NOTE — Progress Notes (Signed)
Please call patient: I have reviewed his/her lab results. Pap smear is normal and HPV testing is negative. Looks good. Will repeat in 5 years.

## 2021-04-26 DIAGNOSIS — Z1231 Encounter for screening mammogram for malignant neoplasm of breast: Secondary | ICD-10-CM | POA: Diagnosis not present

## 2021-04-26 DIAGNOSIS — Z6828 Body mass index (BMI) 28.0-28.9, adult: Secondary | ICD-10-CM | POA: Diagnosis not present

## 2021-04-26 DIAGNOSIS — Z01419 Encounter for gynecological examination (general) (routine) without abnormal findings: Secondary | ICD-10-CM | POA: Diagnosis not present

## 2021-05-03 ENCOUNTER — Other Ambulatory Visit: Payer: Self-pay | Admitting: Obstetrics

## 2021-05-03 DIAGNOSIS — R928 Other abnormal and inconclusive findings on diagnostic imaging of breast: Secondary | ICD-10-CM

## 2021-05-20 ENCOUNTER — Ambulatory Visit
Admission: RE | Admit: 2021-05-20 | Discharge: 2021-05-20 | Disposition: A | Discharge status: Home / Self Care | Payer: Self-pay | Source: Ambulatory Visit | Attending: Obstetrics | Admitting: Obstetrics

## 2021-05-20 ENCOUNTER — Ambulatory Visit
Admission: RE | Admit: 2021-05-20 | Discharge: 2021-05-20 | Disposition: A | Discharge status: Home / Self Care | Payer: BC Managed Care – PPO | Source: Ambulatory Visit | Attending: Obstetrics | Admitting: Obstetrics

## 2021-05-20 ENCOUNTER — Other Ambulatory Visit: Payer: Self-pay

## 2021-05-20 DIAGNOSIS — R928 Other abnormal and inconclusive findings on diagnostic imaging of breast: Secondary | ICD-10-CM

## 2021-05-20 DIAGNOSIS — R922 Inconclusive mammogram: Secondary | ICD-10-CM | POA: Diagnosis not present

## 2021-06-08 ENCOUNTER — Encounter: Payer: Self-pay | Admitting: Gastroenterology

## 2021-06-27 ENCOUNTER — Other Ambulatory Visit: Payer: Self-pay | Admitting: Family Medicine

## 2021-06-28 ENCOUNTER — Other Ambulatory Visit: Payer: Self-pay | Admitting: Family Medicine

## 2021-07-20 ENCOUNTER — Encounter: Payer: Self-pay | Admitting: Family Medicine

## 2021-08-03 ENCOUNTER — Encounter: Payer: BC Managed Care – PPO | Admitting: Gastroenterology

## 2021-09-18 ENCOUNTER — Other Ambulatory Visit: Payer: Self-pay | Admitting: Family Medicine

## 2021-09-28 ENCOUNTER — Ambulatory Visit: Payer: BC Managed Care – PPO | Admitting: Family Medicine

## 2021-09-28 ENCOUNTER — Other Ambulatory Visit: Payer: Self-pay

## 2021-09-28 ENCOUNTER — Encounter: Payer: Self-pay | Admitting: Family Medicine

## 2021-09-28 VITALS — BP 127/89 | HR 74 | Temp 97.7°F | Ht 60.0 in | Wt 144.8 lb

## 2021-09-28 DIAGNOSIS — N920 Excessive and frequent menstruation with regular cycle: Secondary | ICD-10-CM | POA: Diagnosis not present

## 2021-09-28 DIAGNOSIS — I10 Essential (primary) hypertension: Secondary | ICD-10-CM | POA: Diagnosis not present

## 2021-09-28 DIAGNOSIS — M25562 Pain in left knee: Secondary | ICD-10-CM

## 2021-09-28 DIAGNOSIS — E611 Iron deficiency: Secondary | ICD-10-CM

## 2021-09-28 LAB — BASIC METABOLIC PANEL
BUN: 8 mg/dL (ref 6–23)
CO2: 27 mEq/L (ref 19–32)
Calcium: 8.7 mg/dL (ref 8.4–10.5)
Chloride: 105 mEq/L (ref 96–112)
Creatinine, Ser: 0.61 mg/dL (ref 0.40–1.20)
GFR: 106.49 mL/min (ref >60.00)
Glucose, Bld: 100 mg/dL — ABNORMAL HIGH (ref 70–99)
Potassium: 3.6 mEq/L (ref 3.5–5.1)
Sodium: 137 mEq/L (ref 135–145)

## 2021-09-28 LAB — CBC WITH DIFFERENTIAL/PLATELET
Basophils Absolute: 0 10*3/uL (ref 0.0–0.1)
Basophils Relative: 0.4 % (ref 0.0–3.0)
Eosinophils Absolute: 0.4 10*3/uL (ref 0.0–0.7)
Eosinophils Relative: 7.5 % — ABNORMAL HIGH (ref 0.0–5.0)
HCT: 41.4 % (ref 36.0–46.0)
Hemoglobin: 13.5 g/dL (ref 12.0–15.0)
Lymphocytes Relative: 23.9 % (ref 12.0–46.0)
Lymphs Abs: 1.4 10*3/uL (ref 0.7–4.0)
MCHC: 32.5 g/dL (ref 30.0–36.0)
MCV: 87.4 fl (ref 78.0–100.0)
Monocytes Absolute: 0.4 10*3/uL (ref 0.1–1.0)
Monocytes Relative: 6.6 % (ref 3.0–12.0)
Neutro Abs: 3.7 10*3/uL (ref 1.4–7.7)
Neutrophils Relative %: 61.6 % (ref 43.0–77.0)
Platelets: 256 10*3/uL (ref 150.0–400.0)
RBC: 4.74 Mil/uL (ref 3.87–5.11)
RDW: 13.9 % (ref 11.5–15.5)
WBC: 6 10*3/uL (ref 4.0–10.5)

## 2021-09-28 LAB — IRON,TIBC AND FERRITIN PANEL
%SAT: 68 % (calc) — ABNORMAL HIGH (ref 16–45)
Ferritin: 14 ng/mL — ABNORMAL LOW (ref 16–232)
Iron: 251 ug/dL — ABNORMAL HIGH (ref 40–190)
TIBC: 369 mcg/dL (calc) (ref 250–450)

## 2021-09-28 MED ORDER — HYDROCHLOROTHIAZIDE 12.5 MG PO CAPS: 12.5000 mg | ORAL_CAPSULE | Freq: Every day | ORAL | 3 refills | Status: DC

## 2021-09-28 NOTE — Patient Instructions (Addendum)
Please return in August for your annual complete physical; please come fasting. Please sign ROR for your gynecologist. And I need the name and records of your GI who is doing the colonoscopy.  Eat a low salt diet and try to lose a few pounds to keep your blood pressure under good control.  I have refilled your medication for your blood pressure.   I will release your lab results to you on your MyChart account with further instructions. Please reply with any questions.    Ice your knee twice a day for 10 minutes each time and take advil 2 tabs twice a day for a week. Let me know if it doesn't get better.  If you have any questions or concerns, please don't hesitate to send me a message via MyChart or call the office at [phone]. Thank you for visiting with [place] today! It's our pleasure caring for you.

## 2021-09-28 NOTE — Progress Notes (Signed)
Subjective  CC:  Chief Complaint  Patient presents with   Hypertension    HPI: Martha Blankenship is a 46 y.o. female who presents to the office today to address the problems listed above in the chief complaint. Hypertension f/u: Control is fair . Pt reports she is doing well. taking medications as instructed, no medication side effects noted, no TIAs, no chest pain on exertion, no dyspnea on exertion, no swelling of ankles. She denies adverse effects from his BP medications. Compliance with medication is good. Low dose hctz Menorrhagia and history of iron deficiency anemia: Continues to have intermittently heavy periods.  Working with GYN.  Reports she does take a hormonal at times to manage cycles.  I do not have those records currently.  No pain.  She does take iron daily.  No fatigue Complains of 1 week of left knee pain.  Medial knee pain.  Pain with walking.  No locking or give way.  No injury.  No swelling.  Does hear "cracking".  Was recently on vacation in [place].  She is a Engineer, building services.   Assessment  1. Essential hypertension   2. Iron deficiency   3. Menorrhagia with regular cycle   4. Acute pain of left knee      Plan   Hypertension f/u: BP control is fairly well controlled.  Recommend low-salt diet, weight loss and will recheck in 6 months.  Continue HCTZ 12.5 mg daily.  If remains mildly elevated or borderline, will adjust medications upward.  Check BMP.  Patient aware. Iron deficiency f/u: Due to heavy menstrual cycles.  Request GYN records.  Continue iron.  Recheck levels. Acute knee pain: Possible osteoarthritis.  Advil, ice, knee brace as needed and followed up if not improving Health maintenance: Patient reports that she is having a colonoscopy next week.  Uncertain of GI doctor. Education regarding management of these chronic disease states was given. Management strategies discussed on successive visits include dietary and exercise recommendations, goals of  achieving and maintaining IBW, and lifestyle modifications aiming for adequate sleep and minimizing stressors.   Follow up: 6 months for complete physical  Orders Placed This Encounter  Procedures   CBC with Differential/Platelet   Iron, TIBC and Ferritin Panel   Basic metabolic panel   Meds ordered this encounter  Medications   hydrochlorothiazide (MICROZIDE) 12.5 MG capsule    Sig: Take 1 capsule (12.5 mg total) by mouth daily.    Dispense:  90 capsule    Refill:  3      BP Readings from Last 3 Encounters:  09/28/21 127/89  03/28/21 122/88  09/09/20 (!) 144/90   Wt Readings from Last 3 Encounters:  09/28/21 144 lb 12.8 oz (65.7 kg)  03/28/21 142 lb 12.8 oz (64.8 kg)  09/09/20 142 lb 9.6 oz (64.7 kg)    Lab Results  Component Value Date   CHOL 168 03/28/2021   CHOL 172 01/01/2020   Lab Results  Component Value Date   HDL 33.50 (L) 03/28/2021   HDL 32.10 (L) 01/01/2020   Lab Results  Component Value Date   LDLCALC 101 (H) 03/28/2021   LDLCALC 104 (H) 01/01/2020   Lab Results  Component Value Date   TRIG 168.0 (H) 03/28/2021   TRIG 181.0 (H) 01/01/2020   Lab Results  Component Value Date   CHOLHDL 5 03/28/2021   CHOLHDL 5 01/01/2020   No results found for: LDLDIRECT Lab Results  Component Value Date   CREATININE 0.62 03/28/2021  BUN 17 03/28/2021   NA 137 03/28/2021   K 3.7 03/28/2021   CL 103 03/28/2021   CO2 27 03/28/2021    The 10-year ASCVD risk score (Arnett DK, et al., 2019) is: 2%   Values used to calculate the score:     Age: 26 years     Sex: Female     Is Non-Hispanic African American: No     Diabetic: No     Tobacco smoker: No     Systolic Blood Pressure: 536 mmHg     Is BP treated: Yes     HDL Cholesterol: 33.5 mg/dL     Total Cholesterol: 168 mg/dL  I reviewed the patients updated PMH, FH, and SocHx.    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  Social History: Patient  reports that she has never smoked. She has never used smokeless tobacco. She reports that she does not currently use alcohol. She reports that she does not use drugs.  Current Meds  Medication Sig   omeprazole (PRILOSEC) 20 MG capsule TAKE 1 CAPSULE BY MOUTH DAILY AS NEEDED.   [DISCONTINUED] diclofenac (VOLTAREN) 75 MG EC tablet Take 1 tablet (75 mg total) by mouth 2 (two) times daily.   [DISCONTINUED] hydrochlorothiazide (MICROZIDE) 12.5 MG capsule Take 1 capsule (12.5 mg total) by mouth daily.    Review of Systems: Cardiovascular: negative for chest pain, palpitations, leg swelling, orthopnea Respiratory: negative for SOB, wheezing or persistent cough Gastrointestinal: negative for abdominal pain Genitourinary: negative for dysuria or gross hematuria  Objective  Vitals: BP 127/89 (BP Location: Left Arm, Patient Position: Sitting, Cuff Size: Normal)    Pulse 74    Temp 97.7 F (36.5 C) (Temporal)    Ht 5' (1.524 m)    Wt 144 lb 12.8 oz (65.7 kg)    LMP 08/07/2021 (Exact Date)    SpO2 99%    BMI 28.28 kg/m  General: no acute distress  Psych:  Alert and oriented, normal mood and affect HEENT:  Normocephalic, atraumatic, supple neck  Cardiovascular:  RRR without murmur. no edema Respiratory:  Good breath sounds bilaterally, CTAB with normal respiratory effort Left knee: Normal appearing, crepitus, mild medial tenderness.  No tenderness over the bursa's.  No warmth, redness or swelling.  Full range of motion.  Negative Lachman's and McMurray's.  Normal gait.   Commons side effects, risks, benefits, and alternatives for medications and treatment plan prescribed today were discussed, and the patient expressed understanding  of the given instructions. Patient is instructed to call or message via MyChart if he/she has any questions or concerns regarding our treatment plan. No barriers to understanding were identified. We discussed Red Flag symptoms and signs in detail. Patient expressed understanding regarding what to do in case of urgent or emergency type symptoms.  Medication list was reconciled, printed and provided to the patient in AVS. Patient instructions and summary information was reviewed with the patient as documented in the AVS. This note was prepared with assistance of Dragon voice recognition software. Occasional wrong-word or sound-a-like substitutions may have occurred due to the inherent limitations of voice recognition software  This visit occurred during the SARS-CoV-2 public health emergency.  Safety protocols were in place, including screening questions prior to the visit, additional usage of staff PPE, and extensive cleaning of exam room while observing appropriate contact time as indicated for disinfecting solutions.

## 2021-10-02 NOTE — Progress Notes (Signed)
Please call patient: I have reviewed his/her lab results. Iron levels are improved. IF taking iron 3x/day, can decrease to once daily. IF taking once daily, can stop it.  thanks

## 2021-10-03 NOTE — Progress Notes (Signed)
Pt gave verbal understanding.

## 2021-11-09 ENCOUNTER — Other Ambulatory Visit: Payer: Self-pay | Admitting: Family Medicine

## 2022-01-06 IMAGING — MG MM DIGITAL DIAGNOSTIC UNILAT*R* W/ TOMO W/ CAD
4 series · 4 of 12 positions shown · non-contrast
Comparison: Previous exam(s).

CLINICAL DATA: Screening recall for a possible right breast mass.

EXAM:
DIGITAL DIAGNOSTIC UNILATERAL RIGHT MAMMOGRAM WITH TOMOSYNTHESIS AND
CAD; ULTRASOUND RIGHT BREAST LIMITED
TECHNIQUE: Right digital diagnostic mammography and breast tomosynthesis was
performed. The images were evaluated with computer-aided detection.;
Targeted ultrasound examination of the right breast was performed

[R MLO synth-2D]
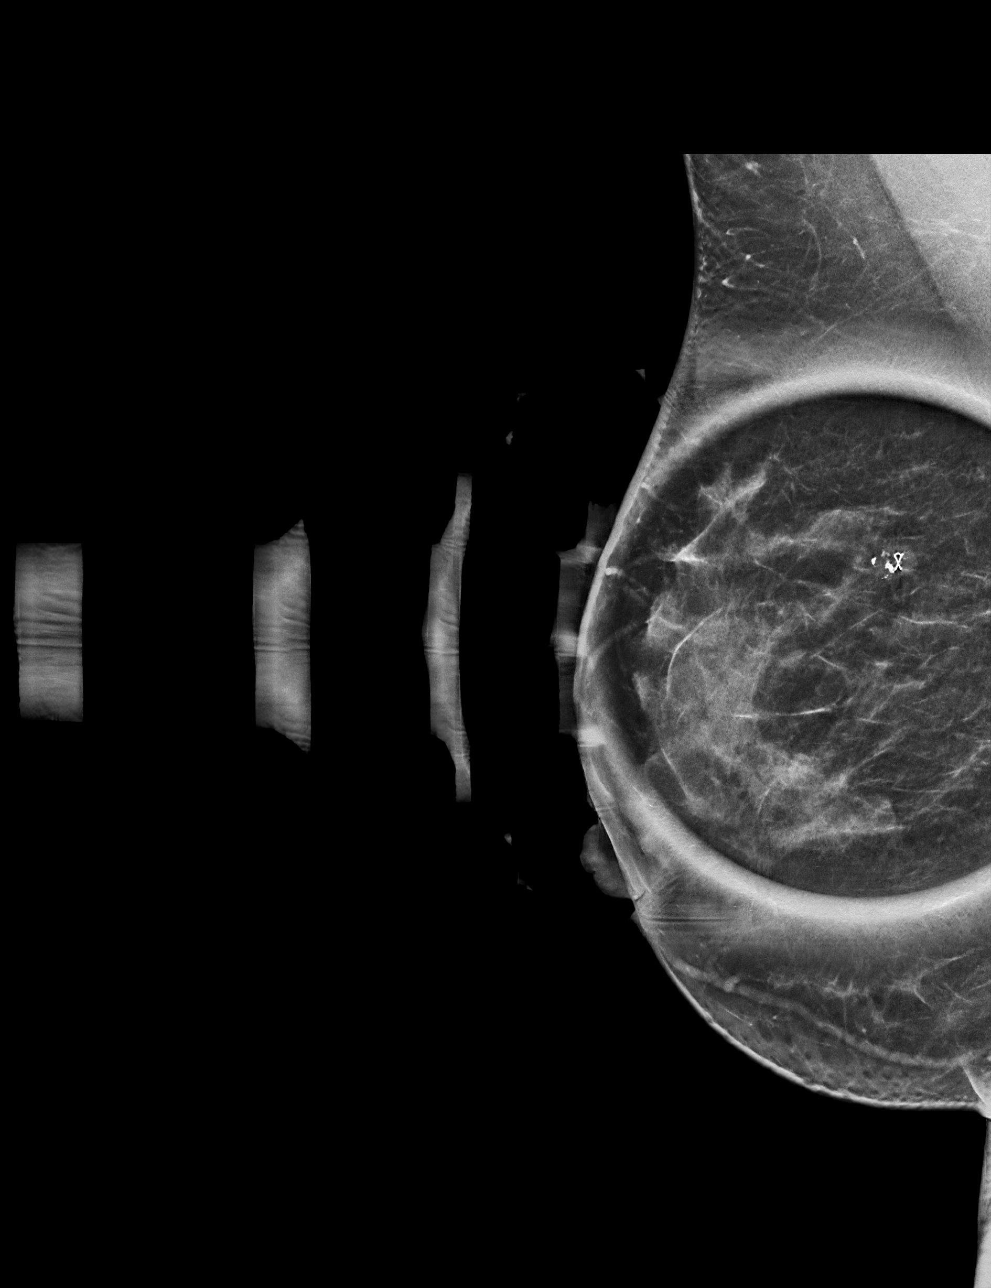

[R CC synth-2D]
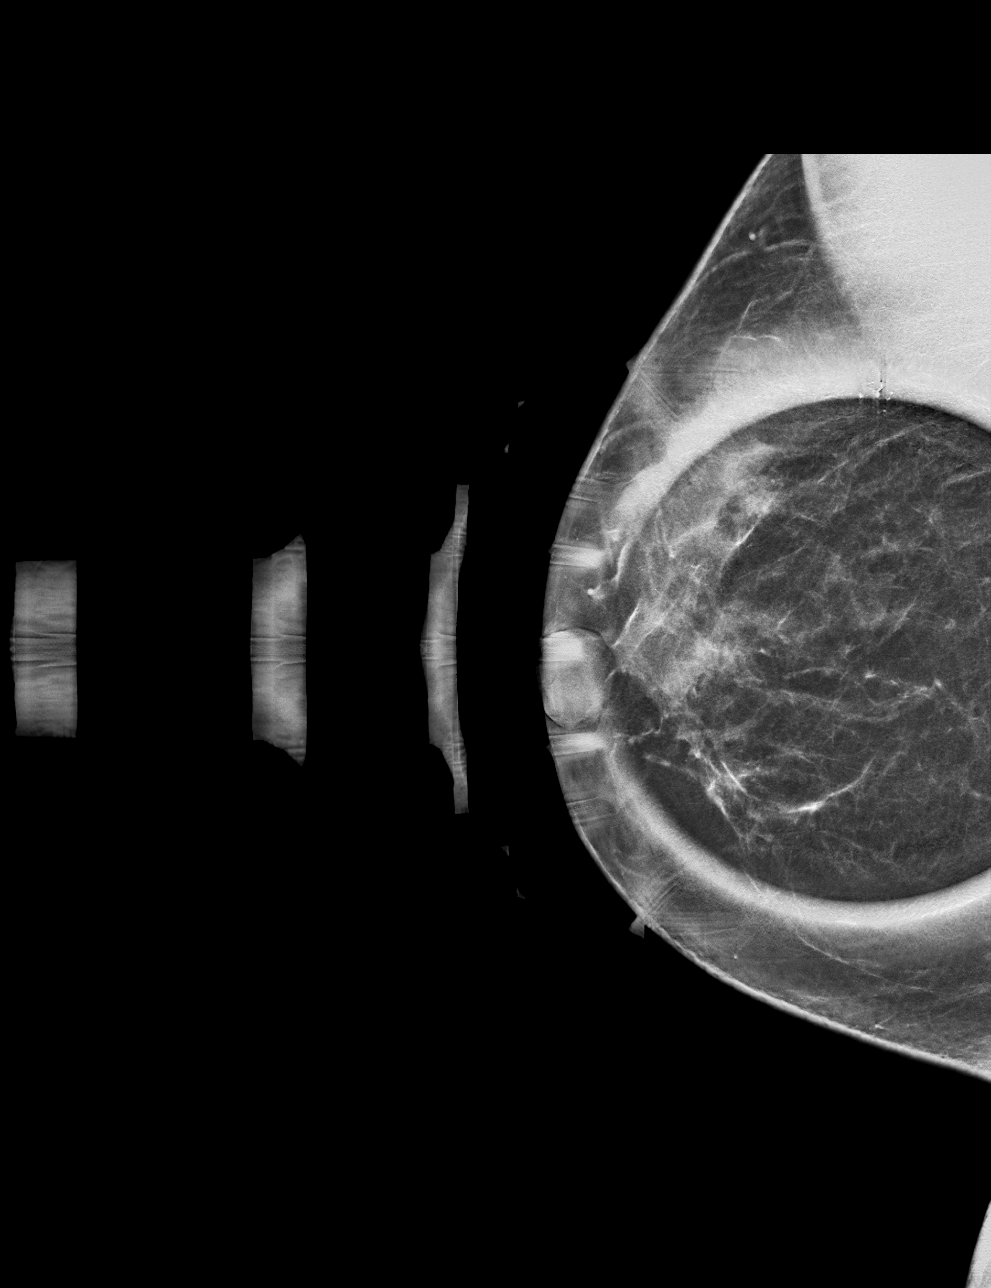

[R CC tomo · tomo slice 30/59.0]
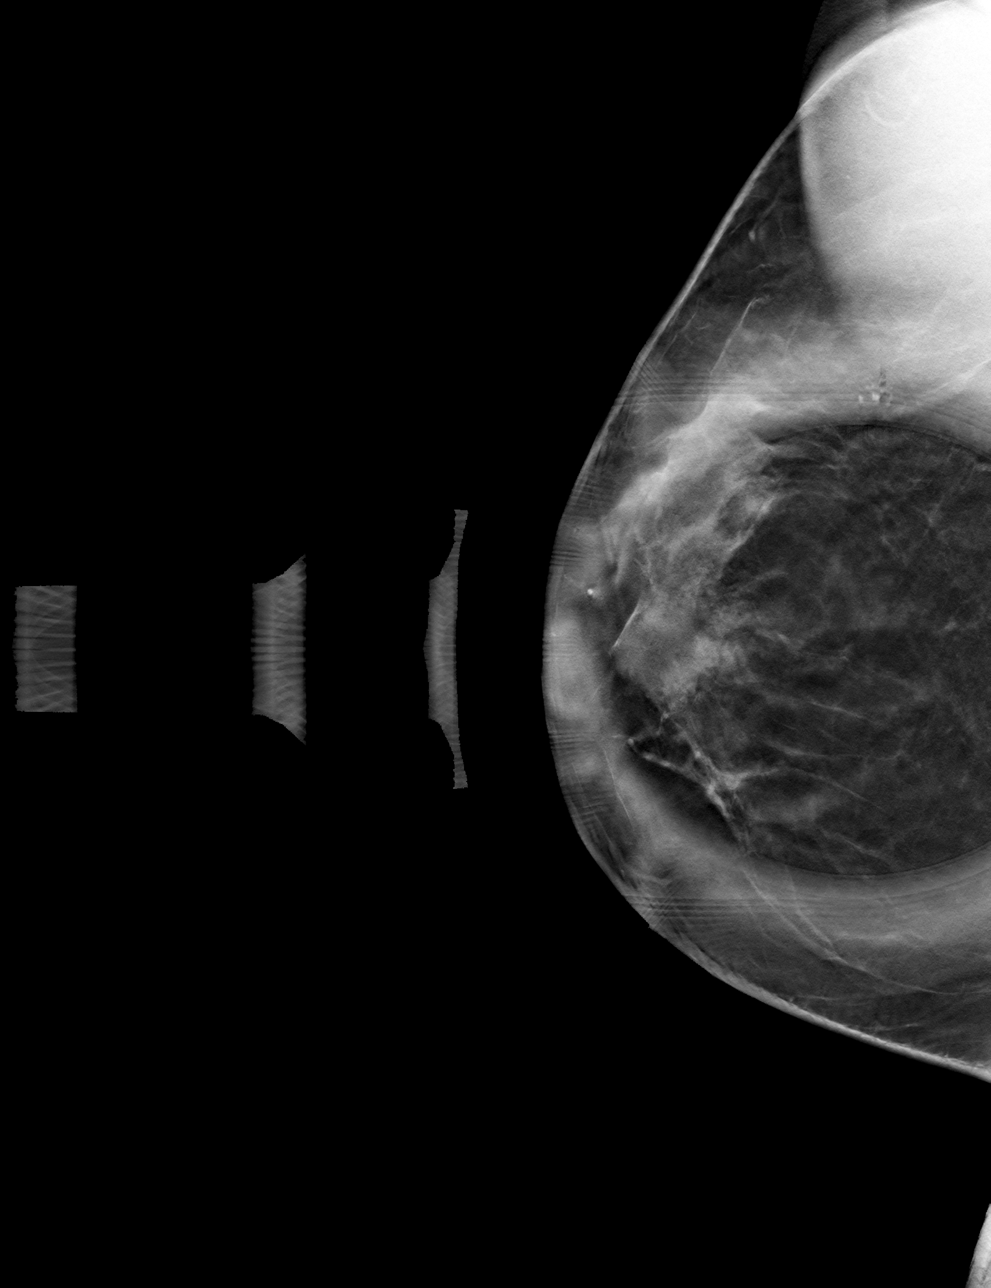

[R MLO tomo · tomo slice 29/57.0]
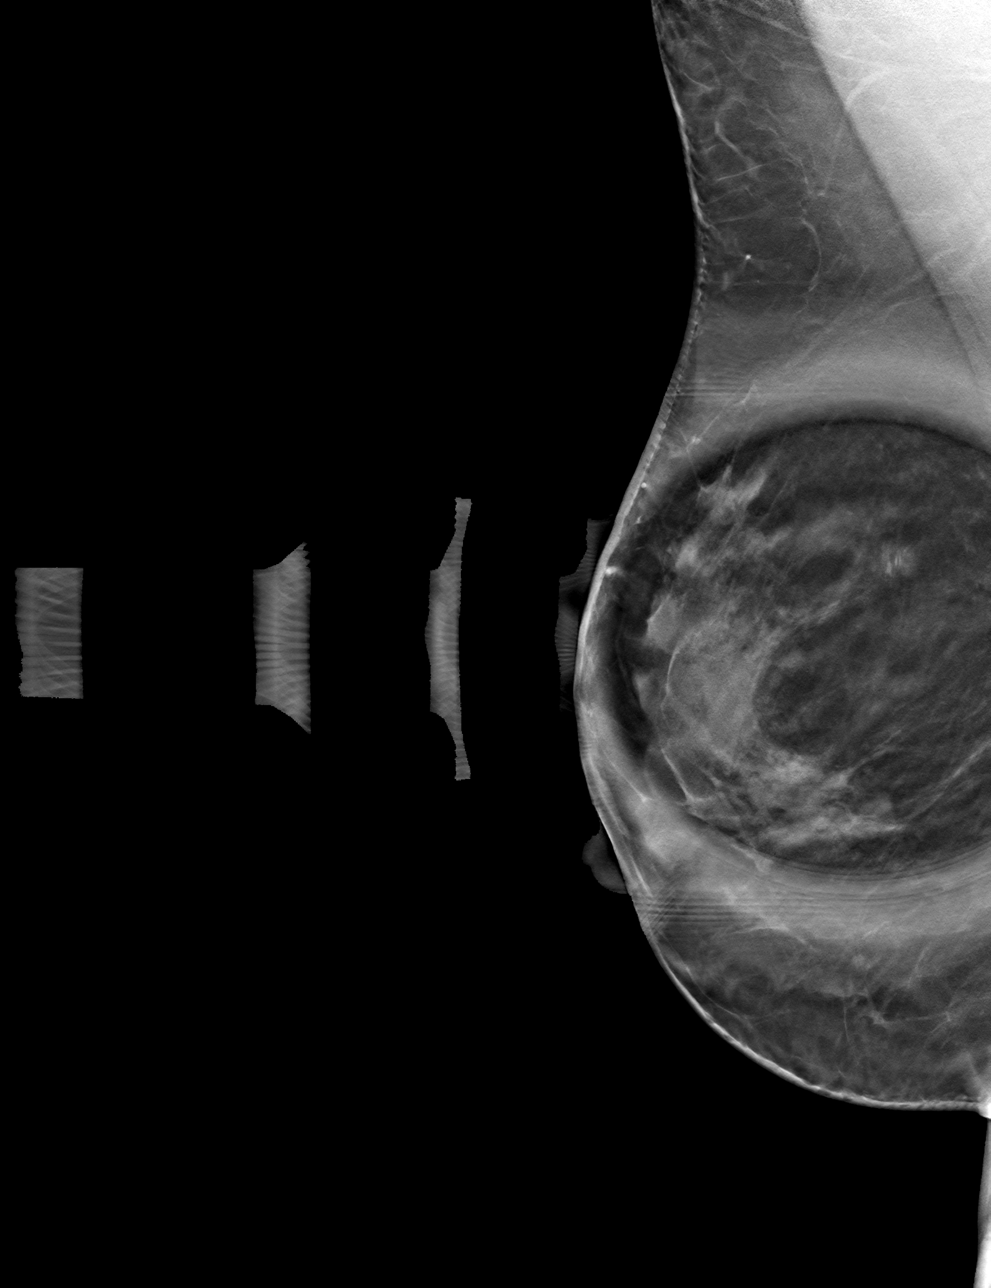

[4 of 12 positions shown; findings below may reference images not displayed]

ACR Breast Density Category c: The breast tissue is heterogeneously
dense, which may obscure small masses.
FINDINGS: The possible mass, noted in the posterior, slightly lateral aspect
of the right breast, near 8 o'clock, faintly persists on the
diagnostic spot-compression images as a low-attenuation, partly
circumscribed, proximally 7 mm mass.

Targeted right breast ultrasound is performed, showing multiple
cysts in the right breast lower outer quadrant and laterally. At 8
o'clock, 1 cm from the nipple there is an oval cyst, middle depth,
measuring 8 x 4 x 8 mm, which likely accounts for the mammographic
mass. Several other similar sized cysts are noted at 9 o'clock.
There are no solid masses or suspicious lesions.
IMPRESSION: 1. No evidence of breast malignancy.
2. Benign right breast cysts.

RECOMMENDATION:
Screening mammogram in one year.(Code:7D-K-FUS)

I have discussed the findings and recommendations with the patient.
If applicable, a reminder letter will be sent to the patient
regarding the next appointment.

BI-RADS CATEGORY  2: Benign.

## 2022-01-06 IMAGING — US US BREAST*R* LIMITED INC AXILLA
1 series · 7 of 7 positions shown · non-contrast
Comparison: Previous exam(s).

CLINICAL DATA: Screening recall for a possible right breast mass.

EXAM:
DIGITAL DIAGNOSTIC UNILATERAL RIGHT MAMMOGRAM WITH TOMOSYNTHESIS AND
CAD; ULTRASOUND RIGHT BREAST LIMITED
TECHNIQUE: Right digital diagnostic mammography and breast tomosynthesis was
performed. The images were evaluated with computer-aided detection.;
Targeted ultrasound examination of the right breast was performed

[Series 1: us breast*right* limited inc axilla · 0.06mm/px · 7 of 7 slices shown]
[im 1/7]
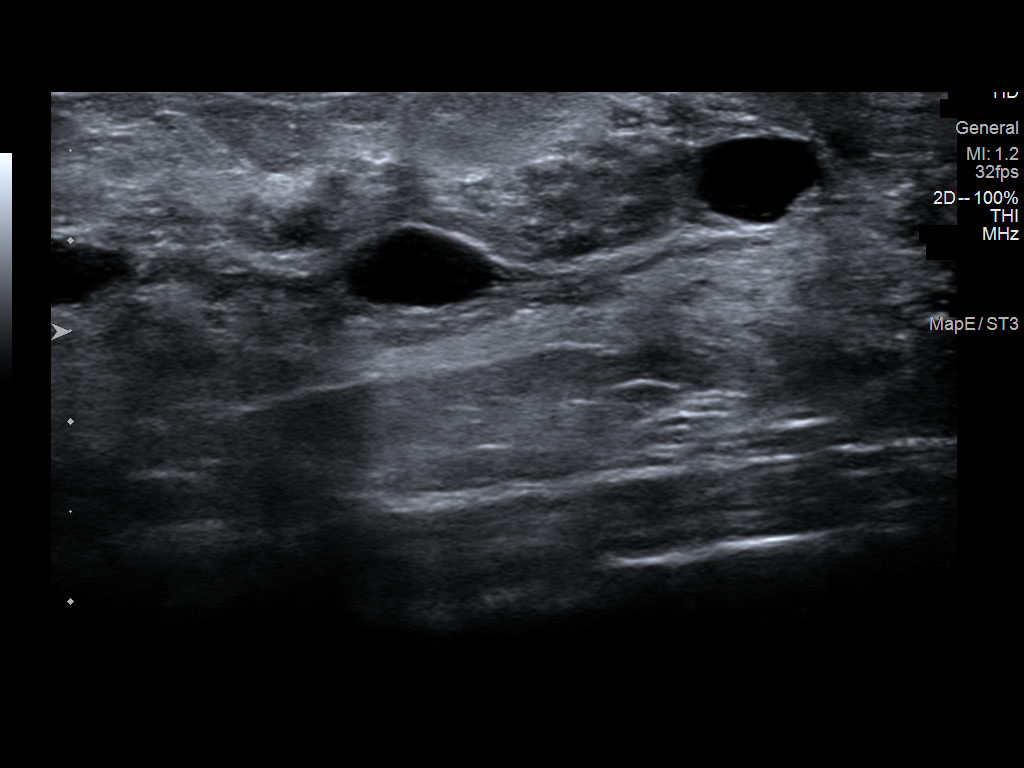
[im 2/7]
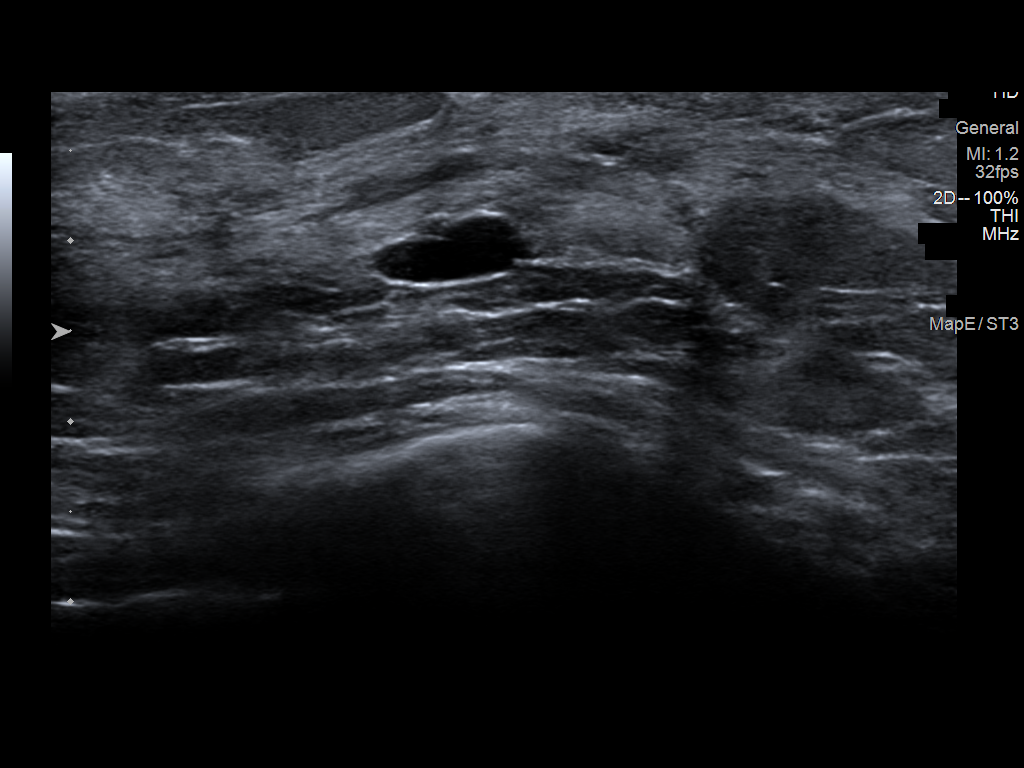
[im 3/7]
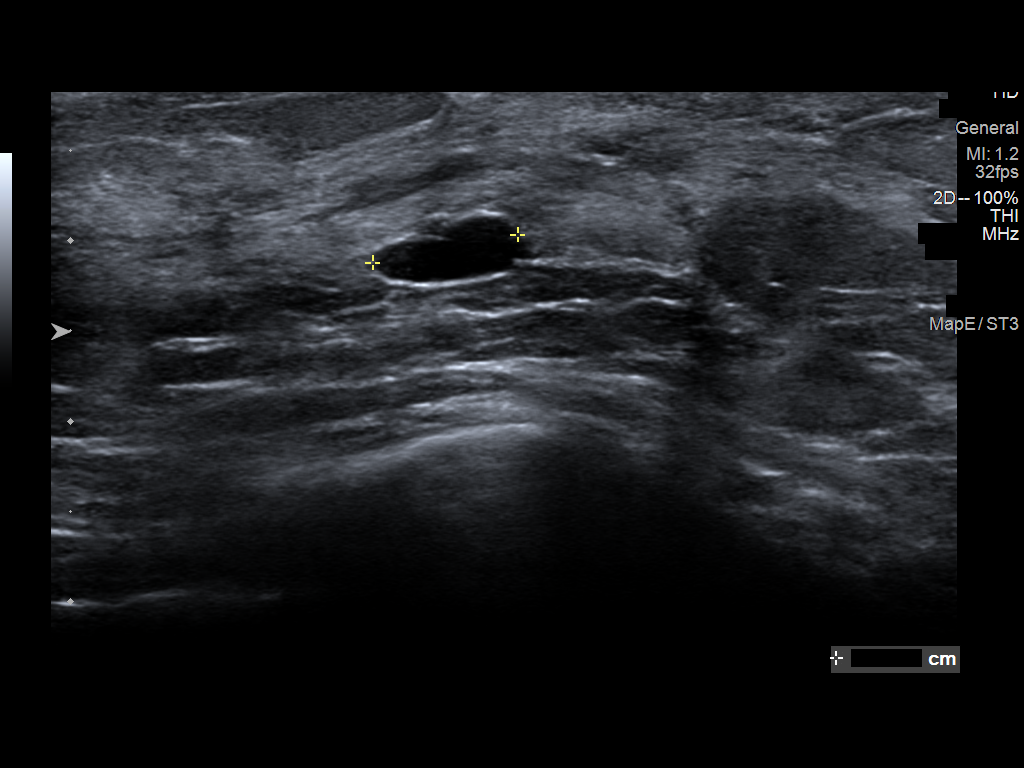
[im 4/7]
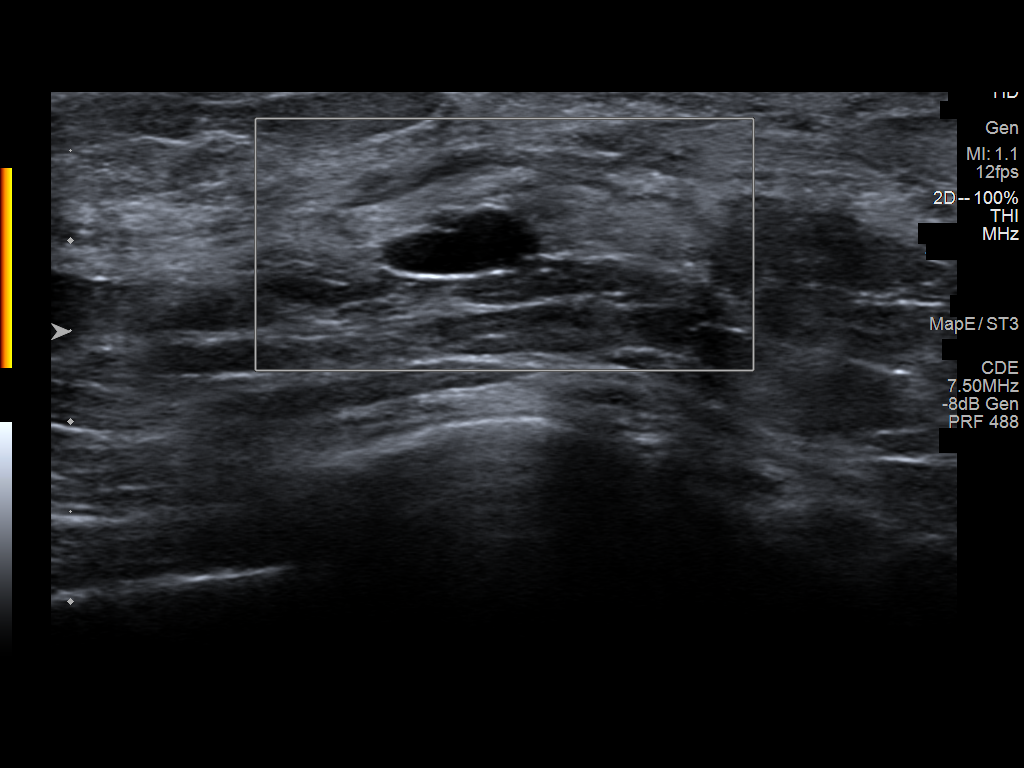
[im 5/7]
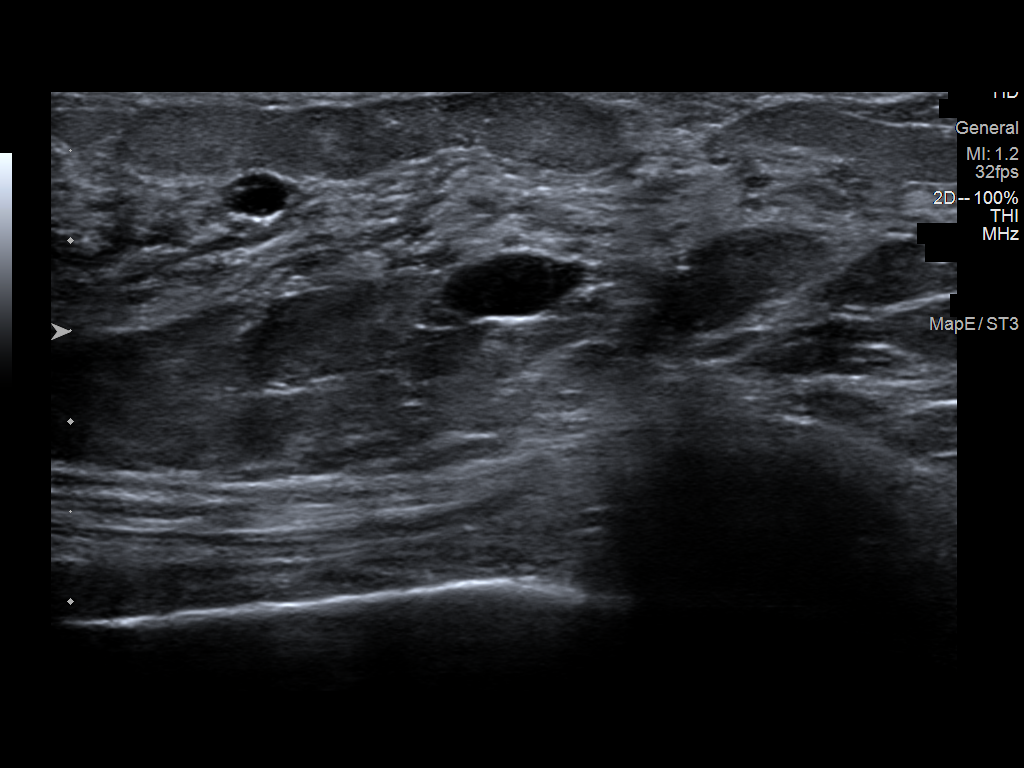
[im 6/7]
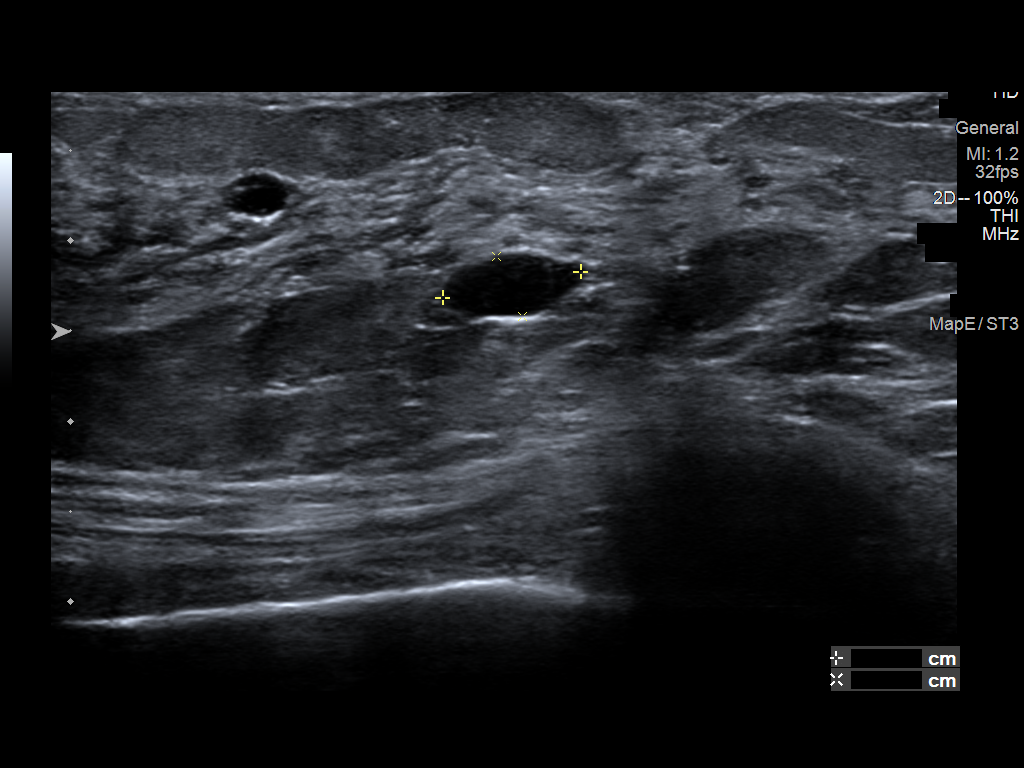
[im 7/7]
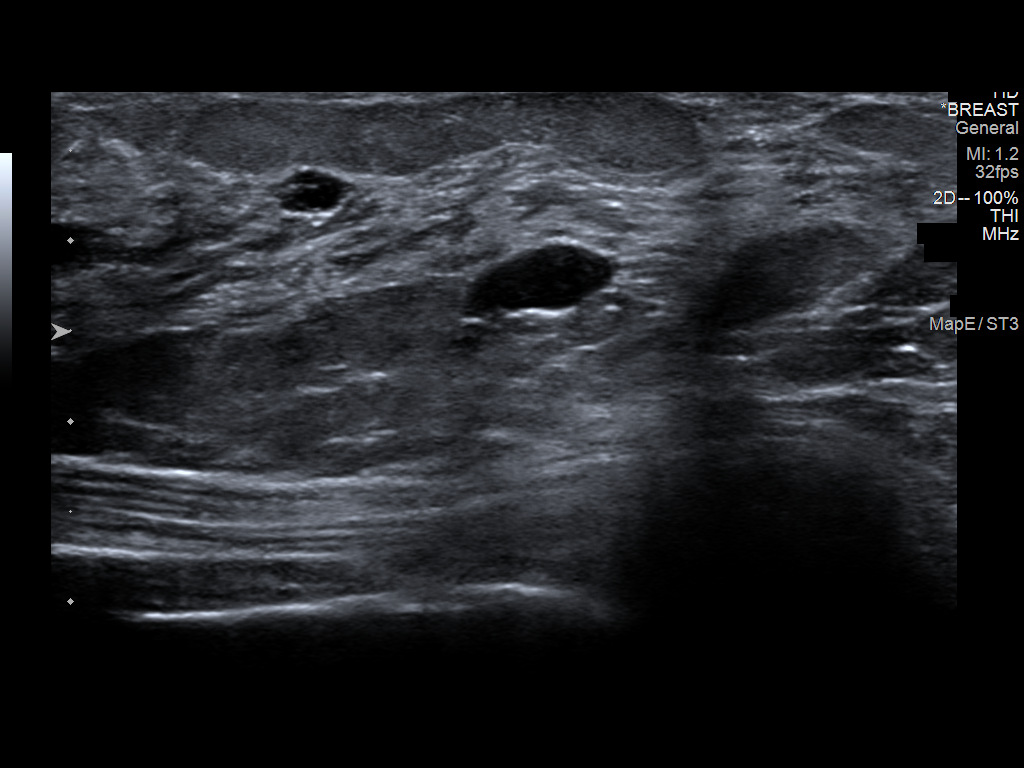

[7 of 7 positions shown; findings below may reference images not displayed]

ACR Breast Density Category c: The breast tissue is heterogeneously
dense, which may obscure small masses.
FINDINGS: The possible mass, noted in the posterior, slightly lateral aspect
of the right breast, near 8 o'clock, faintly persists on the
diagnostic spot-compression images as a low-attenuation, partly
circumscribed, proximally 7 mm mass.

Targeted right breast ultrasound is performed, showing multiple
cysts in the right breast lower outer quadrant and laterally. At 8
o'clock, 1 cm from the nipple there is an oval cyst, middle depth,
measuring 8 x 4 x 8 mm, which likely accounts for the mammographic
mass. Several other similar sized cysts are noted at 9 o'clock.
There are no solid masses or suspicious lesions.
IMPRESSION: 1. No evidence of breast malignancy.
2. Benign right breast cysts.

RECOMMENDATION:
Screening mammogram in one year.(Code:7D-K-FUS)

I have discussed the findings and recommendations with the patient.
If applicable, a reminder letter will be sent to the patient
regarding the next appointment.

BI-RADS CATEGORY  2: Benign.

## 2022-04-03 ENCOUNTER — Encounter: Payer: Self-pay | Admitting: Family Medicine

## 2022-04-03 ENCOUNTER — Ambulatory Visit (INDEPENDENT_AMBULATORY_CARE_PROVIDER_SITE_OTHER): Payer: BC Managed Care – PPO | Admitting: Family Medicine

## 2022-04-03 VITALS — BP 120/82 | HR 75 | Temp 97.6°F | Ht 60.0 in | Wt 138.1 lb

## 2022-04-03 DIAGNOSIS — Z1211 Encounter for screening for malignant neoplasm of colon: Secondary | ICD-10-CM | POA: Diagnosis not present

## 2022-04-03 DIAGNOSIS — K219 Gastro-esophageal reflux disease without esophagitis: Secondary | ICD-10-CM | POA: Diagnosis not present

## 2022-04-03 DIAGNOSIS — Z0000 Encounter for general adult medical examination without abnormal findings: Secondary | ICD-10-CM

## 2022-04-03 DIAGNOSIS — Z1212 Encounter for screening for malignant neoplasm of rectum: Secondary | ICD-10-CM

## 2022-04-03 DIAGNOSIS — N920 Excessive and frequent menstruation with regular cycle: Secondary | ICD-10-CM | POA: Diagnosis not present

## 2022-04-03 DIAGNOSIS — I10 Essential (primary) hypertension: Secondary | ICD-10-CM | POA: Diagnosis not present

## 2022-04-03 LAB — CBC WITH DIFFERENTIAL/PLATELET
Basophils Absolute: 0 10*3/uL (ref 0.0–0.1)
Basophils Relative: 0.6 % (ref 0.0–3.0)
Eosinophils Absolute: 0.4 10*3/uL (ref 0.0–0.7)
Eosinophils Relative: 6.4 % — ABNORMAL HIGH (ref 0.0–5.0)
HCT: 37.6 % (ref 36.0–46.0)
Hemoglobin: 12.4 g/dL (ref 12.0–15.0)
Lymphocytes Relative: 25.6 % (ref 12.0–46.0)
Lymphs Abs: 1.6 10*3/uL (ref 0.7–4.0)
MCHC: 32.9 g/dL (ref 30.0–36.0)
MCV: 83.6 fl (ref 78.0–100.0)
Monocytes Absolute: 0.4 10*3/uL (ref 0.1–1.0)
Monocytes Relative: 7.3 % (ref 3.0–12.0)
Neutro Abs: 3.7 10*3/uL (ref 1.4–7.7)
Neutrophils Relative %: 60.1 % (ref 43.0–77.0)
Platelets: 231 10*3/uL (ref 150.0–400.0)
RBC: 4.5 Mil/uL (ref 3.87–5.11)
RDW: 13.8 % (ref 11.5–15.5)
WBC: 6.1 10*3/uL (ref 4.0–10.5)

## 2022-04-03 MED ORDER — HYDROCHLOROTHIAZIDE 12.5 MG PO CAPS: 12.5000 mg | ORAL_CAPSULE | Freq: Every day | ORAL | 3 refills | Status: DC

## 2022-04-03 NOTE — Patient Instructions (Addendum)
Please return in 12 months for your annual complete physical; please come fasting.   Please reschedule your colonoscopy now.   I will release your lab results to you on your MyChart account with further instructions. You may see the results before I do, but when I review them I will send you a message with my report or have my assistant call you if things need to be discussed. Please reply to my message with any questions. Thank you!   If you have any questions or concerns, please don't hesitate to send me a message via MyChart or call the office at [phone]. Thank you for visiting with [place] today! It's our pleasure caring for you.   Please do these things to maintain good health!  Exercise at least 30-45 minutes a day,  4-5 days a week.  Eat a low-fat diet with lots of fruits and vegetables, up to 7-9 servings per day. Drink plenty of water daily. Try to drink 8 8oz glasses per day. Seatbelts can save your life. Always wear your seatbelt. Place Smoke Detectors on every level of your home and check batteries every year. Schedule an appointment with an eye doctor for an eye exam every 1-2 years Safe sex - use condoms to protect yourself from STDs if you could be exposed to these types of infections. Use birth control if you do not want to become pregnant and are sexually active. Avoid heavy alcohol use. If you drink, keep it to less than 2 drinks/day and not every day. [place].  Choose someone you trust that could speak for you if you became unable to speak for yourself. Depression is common in our stressful world.If you're feeling down or losing interest in things you normally enjoy, please come in for a visit. If anyone is threatening or hurting you, please get help. Physical or Emotional Violence is never OK.

## 2022-04-03 NOTE — Progress Notes (Signed)
Subjective  Chief Complaint  Patient presents with   Annual Exam    Pt here for Annual exam and is currently fasting     HPI: Martha Blankenship is a 46 y.o. female who presents to [place] [facility] today for a Female Wellness Visit. She also has the concerns and/or needs as listed above in the chief complaint. These will be addressed in addition to the Health Maintenance Visit.   Wellness Visit: annual visit with health maintenance review and exam without Pap  HM: sees Dr. Carlis Abbott for female wellness. Doing ok with menstrual cycles. On 5 days of hormones as needed to slow bleeding if needed. Has to reschedule colonoscopy ([facility]) due to aunt passing and needing to be out of town. Imms current. Works full time. No concerns Chronic disease f/u and/or acute problem visit: (deemed necessary to be done in addition to the wellness visit): No longer with GERD sxs.  HTN Is controlled on 12.5 hctz daily   Assessment  1. Annual physical exam   2. Essential hypertension   3. Menorrhagia with regular cycle   4. Gastroesophageal reflux disease without esophagitis   5. Screening for colorectal cancer      Plan  Female Wellness Visit: Age appropriate Health Maintenance and Prevention measures were discussed with patient. Included topics are cancer screening recommendations, ways to keep healthy (see AVS) including dietary and exercise recommendations, regular eye and dental care, use of seat belts, and avoidance of moderate alcohol use and tobacco use. Pt to call and reschedule colonoscopy.  BMI: discussed patient's BMI and encouraged positive lifestyle modifications to help get to or maintain a target BMI. HM needs and immunizations were addressed and ordered. See below for orders. See HM and immunization section for updates. Routine labs and screening tests ordered including cmp, cbc and lipids where appropriate. Discussed recommendations regarding Vit D and calcium  supplementation (see AVS)  Chronic disease management visit and/or acute problem visit: HTN: This medical condition is well controlled. There are no signs of complications, medication side effects, or red flags. Patient is instructed to continue the current treatment plan without change in therapies or medications.  Continue hctz 12.5 daily. Check renal and lytes today  Follow up: 12 mo for cpe and htn recheck  Orders Placed This Encounter  Procedures   CBC with Differential/Platelet   Comprehensive metabolic panel   Lipid panel   Meds ordered this encounter  Medications   hydrochlorothiazide (MICROZIDE) 12.5 MG capsule    Sig: Take 1 capsule (12.5 mg total) by mouth daily.    Dispense:  90 capsule    Refill:  3      Body mass index is 26.97 kg/m. 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    Health Maintenance  Topic Date Due   COLONOSCOPY (Pts 45-69yr Insurance coverage will need to be confirmed)  Never done   COVID-19 Vaccine (3 - Booster for [employer]) 04/19/2022 (Originally 09/18/2020)   MAMMOGRAM  04/26/2022   PAP SMEAR-Modifier  03/28/2026   TETANUS/TDAP  12/31/2029   Hepatitis C Screening  Completed   HIV Screening  Completed   HPV VACCINES  Aged Out   INFLUENZA VACCINE  Discontinued   Immunization History  Administered Date(s) Administered   Janssen (J&J) SARS-COV-2 Vaccination 11/18/2019   PFIZER(Purple Top)SARS-COV-2 Vaccination 07/24/2020   Tdap 09/28/2006, 12/12/2016, 01/01/2020   We updated and reviewed the patient's past history in detail and it is documented below. Allergies: Patient has No  Known Allergies. 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 Past Surgical History Patient  has a past surgical history that includes Cesarean section and Breast biopsy (Right, 12/2014). Family History: Patient family history includes Alzheimer's disease in her maternal grandmother; Bone cancer in her maternal grandmother; Diabetes in her mother; Healthy in her son; Hypertension in her father and paternal grandfather. Social History:  Patient  reports that she has never smoked. She has never used smokeless tobacco. She reports that she does not currently use alcohol. She reports that she does not use drugs.  Review of Systems: Constitutional: negative for fever or malaise Ophthalmic: negative for photophobia, double vision or loss of vision Cardiovascular: negative for chest pain, dyspnea on exertion, or new LE swelling Respiratory: negative for SOB or persistent cough Gastrointestinal: negative for abdominal pain, change in bowel habits or melena Genitourinary: negative for dysuria or gross hematuria, no abnormal uterine bleeding or disharge Musculoskeletal: negative for new gait disturbance or muscular weakness Integumentary: negative for new or persistent rashes, no breast lumps Neurological: negative for TIA or stroke symptoms Psychiatric: negative for SI or delusions Allergic/Immunologic: negative for hives  Patient Care Team    Relationship Specialty Notifications Start End  Leamon Arnt, MD PCP - General Family Medicine  07/02/19   Jerelyn Charles, MD Consulting Physician Obstetrics  07/02/19   Juanita Craver, MD Consulting Physician Gastroenterology  07/02/19   Ob/Gyn, Esmond Plants    09/28/21     Objective  Vitals: BP 120/82   Pulse 75   Temp 97.6 F (36.4 C)   Ht 5' (1.524 m)   Wt 138 lb 1.6 oz (62.6 kg)   SpO2 99%   BMI 26.97 kg/m  General:  Well  developed, well nourished, no acute distress  Psych:  Alert and orientedx3,normal mood and affect HEENT:  Normocephalic, atraumatic, non-icteric sclera,  supple neck without adenopathy, mass or thyromegaly Cardiovascular:  Normal S1, S2, RRR without gallop, rub or murmur Respiratory:  Good breath sounds bilaterally, CTAB with normal respiratory effort Gastrointestinal: normal bowel sounds, soft, non-tender, no noted masses. No HSM MSK: no deformities, contusions. Joints are without erythema or swelling.  Skin:  Warm, no rashes or suspicious lesions noted Neurologic:    Mental status is normal. Gross motor and sensory exams are normal. Normal gait. No tremor   Commons side effects, risks, benefits, and alternatives for medications and treatment plan prescribed today were discussed, and the patient expressed understanding of the given instructions. Patient is instructed to call or message via MyChart if he/she has any questions or concerns regarding our treatment plan. No barriers to understanding were identified. We discussed Red Flag symptoms and signs in detail. Patient expressed understanding regarding what to do in case of urgent or emergency type symptoms.  Medication list was reconciled, printed and provided to the patient in AVS. Patient instructions and summary information was reviewed with the patient as documented in the AVS. This note was prepared with assistance of Dragon voice recognition software. Occasional  wrong-word or sound-a-like substitutions may have occurred due to the inherent limitations of voice recognition software  This visit occurred during the SARS-CoV-2 public health emergency.  Safety protocols were in place, including screening questions prior to the visit, additional usage of staff PPE, and extensive cleaning of exam room while observing appropriate contact time as indicated for disinfecting solutions.

## 2022-04-04 LAB — LIPID PANEL
Cholesterol: 144 mg/dL (ref 0–200)
HDL: 28.9 mg/dL — ABNORMAL LOW (ref >39.00)
LDL Cholesterol: 81 mg/dL (ref 0–99)
NonHDL: 115.11
Total CHOL/HDL Ratio: 5
Triglycerides: 171 mg/dL — ABNORMAL HIGH (ref 0.0–149.0)
VLDL: 34.2 mg/dL (ref 0.0–40.0)

## 2022-04-04 LAB — COMPREHENSIVE METABOLIC PANEL
ALT: 14 U/L (ref 0–35)
AST: 18 U/L (ref 0–37)
Albumin: 3.9 g/dL (ref 3.5–5.2)
Alkaline Phosphatase: 46 U/L (ref 39–117)
BUN: 18 mg/dL (ref 6–23)
CO2: 27 mEq/L (ref 19–32)
Calcium: 8.7 mg/dL (ref 8.4–10.5)
Chloride: 102 mEq/L (ref 96–112)
Creatinine, Ser: 0.7 mg/dL (ref 0.40–1.20)
GFR: 102.64 mL/min (ref >60.00)
Glucose, Bld: 99 mg/dL (ref 70–99)
Potassium: 4.7 mEq/L (ref 3.5–5.1)
Sodium: 141 mEq/L (ref 135–145)
Total Bilirubin: 0.4 mg/dL (ref 0.2–1.2)
Total Protein: 7 g/dL (ref 6.0–8.3)

## 2022-04-04 NOTE — Progress Notes (Signed)
Please call patient: I have reviewed his/her lab results. Lab results look fine.

## 2022-05-11 DIAGNOSIS — Z1231 Encounter for screening mammogram for malignant neoplasm of breast: Secondary | ICD-10-CM | POA: Diagnosis not present

## 2022-05-11 DIAGNOSIS — Z01419 Encounter for gynecological examination (general) (routine) without abnormal findings: Secondary | ICD-10-CM | POA: Diagnosis not present

## 2022-05-11 DIAGNOSIS — Z6827 Body mass index (BMI) 27.0-27.9, adult: Secondary | ICD-10-CM | POA: Diagnosis not present

## 2023-04-05 ENCOUNTER — Encounter: Payer: Self-pay | Admitting: Family Medicine

## 2023-04-05 ENCOUNTER — Ambulatory Visit: Payer: Commercial Managed Care - PPO | Admitting: Family Medicine

## 2023-04-05 VITALS — BP 122/78 | HR 80 | Temp 98.0°F | Ht 60.0 in | Wt 139.8 lb

## 2023-04-05 DIAGNOSIS — D1801 Hemangioma of skin and subcutaneous tissue: Secondary | ICD-10-CM | POA: Diagnosis not present

## 2023-04-05 DIAGNOSIS — I10 Essential (primary) hypertension: Secondary | ICD-10-CM

## 2023-04-05 DIAGNOSIS — Z0000 Encounter for general adult medical examination without abnormal findings: Secondary | ICD-10-CM

## 2023-04-05 DIAGNOSIS — D509 Iron deficiency anemia, unspecified: Secondary | ICD-10-CM

## 2023-04-05 DIAGNOSIS — K219 Gastro-esophageal reflux disease without esophagitis: Secondary | ICD-10-CM

## 2023-04-05 DIAGNOSIS — Z1211 Encounter for screening for malignant neoplasm of colon: Secondary | ICD-10-CM | POA: Diagnosis not present

## 2023-04-05 DIAGNOSIS — Z1212 Encounter for screening for malignant neoplasm of rectum: Secondary | ICD-10-CM | POA: Diagnosis not present

## 2023-04-05 DIAGNOSIS — D249 Benign neoplasm of unspecified breast: Secondary | ICD-10-CM | POA: Diagnosis not present

## 2023-04-05 DIAGNOSIS — N920 Excessive and frequent menstruation with regular cycle: Secondary | ICD-10-CM

## 2023-04-05 LAB — CBC WITH DIFFERENTIAL/PLATELET
Basophils Absolute: 0.1 10*3/uL (ref 0.0–0.1)
Basophils Relative: 1.1 % (ref 0.0–3.0)
Eosinophils Absolute: 0.6 10*3/uL (ref 0.0–0.7)
Eosinophils Relative: 9.9 % — ABNORMAL HIGH (ref 0.0–5.0)
HCT: 28.8 % — ABNORMAL LOW (ref 36.0–46.0)
Hemoglobin: 8.7 g/dL — ABNORMAL LOW (ref 12.0–15.0)
Lymphocytes Relative: 24.8 % (ref 12.0–46.0)
Lymphs Abs: 1.4 10*3/uL (ref 0.7–4.0)
MCHC: 30.3 g/dL (ref 30.0–36.0)
MCV: 65.1 fl — ABNORMAL LOW (ref 78.0–100.0)
Monocytes Absolute: 0.4 10*3/uL (ref 0.1–1.0)
Monocytes Relative: 7.4 % (ref 3.0–12.0)
Neutro Abs: 3.2 10*3/uL (ref 1.4–7.7)
Neutrophils Relative %: 56.8 % (ref 43.0–77.0)
Platelets: 369 10*3/uL (ref 150.0–400.0)
RBC: 4.41 Mil/uL (ref 3.87–5.11)
RDW: 18.3 % — ABNORMAL HIGH (ref 11.5–15.5)
WBC: 5.6 10*3/uL (ref 4.0–10.5)

## 2023-04-05 LAB — COMPREHENSIVE METABOLIC PANEL
ALT: 17 U/L (ref 0–35)
AST: 20 U/L (ref 0–37)
Albumin: 3.9 g/dL (ref 3.5–5.2)
Alkaline Phosphatase: 43 U/L (ref 39–117)
BUN: 14 mg/dL (ref 6–23)
CO2: 28 mEq/L (ref 19–32)
Calcium: 8.7 mg/dL (ref 8.4–10.5)
Chloride: 104 mEq/L (ref 96–112)
Creatinine, Ser: 0.59 mg/dL (ref 0.40–1.20)
GFR: 106.21 mL/min (ref >60.00)
Glucose, Bld: 98 mg/dL (ref 70–99)
Potassium: 3.9 mEq/L (ref 3.5–5.1)
Sodium: 137 mEq/L (ref 135–145)
Total Bilirubin: 0.6 mg/dL (ref 0.2–1.2)
Total Protein: 6.7 g/dL (ref 6.0–8.3)

## 2023-04-05 LAB — LIPID PANEL
Cholesterol: 158 mg/dL (ref 0–200)
HDL: 38 mg/dL — ABNORMAL LOW (ref >39.00)
LDL Cholesterol: 93 mg/dL (ref 0–99)
NonHDL: 119.64
Total CHOL/HDL Ratio: 4
Triglycerides: 131 mg/dL (ref 0.0–149.0)
VLDL: 26.2 mg/dL (ref 0.0–40.0)

## 2023-04-05 LAB — TSH: TSH: 1.22 u[IU]/mL (ref 0.35–5.50)

## 2023-04-05 NOTE — Progress Notes (Signed)
Subjective  Chief Complaint  Patient presents with   Annual Exam    Pt here for Annual Exam and is currently fasting.Pt needs orders for mammo/colonoscopy to get scheduled     HPI: Martha Blankenship is a 47 y.o. female who presents to [facility] [place] today for a Female Wellness Visit. She also has the concerns and/or needs as listed above in the chief complaint. These will be addressed in addition to the Health Maintenance Visit.   Wellness Visit: annual visit with health maintenance review and exam without Pap  HM: Pap up to date. Due mammo and colonoscopy. Feels well.  She gets her mammography done at [facility].  She will schedule September.  Missed second colonoscopy due to another death in the family requiring her to travel to [place].  She would like to reschedule.  She has seen Dr. Loreta Ave in the past and would like to continue there.  Refuses flu vaccinations.  Eye exam current. Chronic disease f/u and/or acute problem visit: (deemed necessary to be done in addition to the wellness visit): HTN: Well-controlled on HCTZ 12.5 mg daily.  No chest pain, palpitations or edema.  Compliant with medications.  Active GERD: No longer with significant bloating or GERD symptoms.  No longer requiring PPI.  She has used this successfully in the past.  Had normal EGD in 2019. 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  Elected not to have any treatment done.  Will follow History of menorrhagia and iron deficiency anemia: Menstrual cycles have regulated.  They are regular and light.  No longer on iron  Assessment  1. Annual physical exam   2. Essential hypertension   3. Gastroesophageal reflux disease without esophagitis   4. Screening for colorectal cancer   5. 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 Angioma of skin nose   7. Menorrhagia with regular cycle      Plan  Female Wellness Visit: Age appropriate  Health Maintenance and Prevention measures were discussed with patient. Included topics are cancer screening recommendations, ways to keep healthy (see AVS) including dietary and exercise recommendations, regular eye and dental care, use of seat belts, and avoidance of moderate alcohol use and tobacco use.  Patient to schedule mammogram.  Referral to Dr. Loreta Ave for colonoscopy BMI: discussed patient's BMI and encouraged positive lifestyle modifications to help get to or maintain a target BMI. HM needs and immunizations were addressed and ordered. See below for orders. See HM and immunization section for updates. Routine labs and screening tests ordered including cmp, cbc and lipids where appropriate. Discussed recommendations regarding Vit D and calcium supplementation (see AVS)  Chronic disease management visit and/or acute problem visit: Hypertension is well-controlled continue HCTZ 12.5 mg daily and check renal function and electrolytes and lipids GERD symptoms have resolved. History of menorrhagia and iron deficiency anemia: Recheck CBC to ensure stable.  Menstrual cycles are now regular.  She is not on birth control. Angioma, reassured  Follow up: 12 months for complete physical and follow-up Orders Placed This Encounter  Procedures   CBC with Differential/Platelet   Comprehensive metabolic panel   Lipid panel   TSH   No orders of the defined types were placed in this encounter.     Body mass index is 27.3 kg/m. Wt Readings from Last 3 Encounters:  04/05/23 139 lb 12.8 oz (63.4 kg)  04/03/22 138 lb 1.6 oz (62.6 kg)  09/28/21 144  lb 12.8 oz (65.7 kg)     Patient Active Problem List   Diagnosis Date Noted Date Diagnosed   Essential hypertension 03/28/2021     Priority: High   GERD (gastroesophageal reflux disease) 07/02/2019     Priority: Medium     Normal EGD 2019 for bloating. Improved with PPI. Hispanic diet.    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      Priority: Medium    Angioma of skin nose 04/05/2023     Priority: Low    confirmed angioma by Derm    Health Maintenance  Topic Date Due   Colonoscopy  Never done   COVID-19 Vaccine (3 - 2023-24 season) 04/21/2023 (Originally 04/28/2022)   MAMMOGRAM  05/28/2023   PAP SMEAR-Modifier  03/28/2026   DTaP/Tdap/Td (4 - Td or Tdap) 12/31/2029   Hepatitis C Screening  Completed   HIV Screening  Completed   HPV VACCINES  Aged Out   INFLUENZA VACCINE  Discontinued   Immunization History  Administered Date(s) Administered   Janssen (J&J) SARS-COV-2 Vaccination 11/18/2019   PFIZER(Purple Top)SARS-COV-2 Vaccination 07/24/2020   Tdap 09/28/2006, 12/12/2016, 01/01/2020   We updated and reviewed the patient's past history in detail and it is documented below. Allergies: Patient has No Known Allergies. 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 Past Surgical History Patient  has a past surgical history that includes Cesarean section and Breast biopsy (Right, 12/2014). Family History: Patient family history includes Alzheimer's disease in her maternal grandmother; Bone cancer in her maternal grandmother; Diabetes in her mother; Healthy in her son; Hypertension in her father and paternal grandfather. Social History:  Patient  reports that she has never smoked. She has never used smokeless tobacco. She reports that she does not currently use alcohol. She reports that she does not use drugs.  Review of Systems: Constitutional: negative for fever or malaise Ophthalmic: negative for photophobia, double vision or loss of vision Cardiovascular: negative for chest pain, dyspnea on exertion, or new LE swelling Respiratory: negative for SOB or persistent cough Gastrointestinal: negative for abdominal pain, change in bowel  habits or melena Genitourinary: negative for dysuria or gross hematuria, no abnormal uterine bleeding or disharge Musculoskeletal: negative for new gait disturbance or muscular weakness Integumentary: negative for new or persistent rashes, no breast lumps Neurological: negative for TIA or stroke symptoms Psychiatric: negative for SI or delusions Allergic/Immunologic: negative for hives  Patient Care Team    Relationship Specialty Notifications Start End  Willow Ora, MD PCP - General Family Medicine  07/02/19   Marlow Baars, MD Consulting Physician Obstetrics  07/02/19   Charna Elizabeth, MD Consulting Physician Gastroenterology  07/02/19   Ob/Gyn, Nestor Ramp    09/28/21     Objective  Vitals: BP 122/78   Pulse 80   Temp 98 F (36.7 C)   Ht 5' (1.524 m)   Wt 139 lb 12.8 oz (63.4 kg)   SpO2 97%   BMI 27.30 kg/m  General:  Well developed, well nourished, no acute distress  Psych:  Alert and orientedx3,normal mood and affect HEENT:  Normocephalic, atraumatic, non-icteric sclera,  supple neck without adenopathy, mass or thyromegaly Cardiovascular:  Normal S1, S2, RRR without gallop, rub or murmur Respiratory:  Good breath sounds bilaterally, CTAB with normal respiratory effort Gastrointestinal: normal bowel sounds, soft, non-tender,  no noted masses. No HSM MSK: extremities without edema, joints without erythema or swelling Neurologic:    Mental status is normal.  Gross motor and sensory exams are normal.  No tremor Skin: 3 to 4 mm angioma and nasal bridge, solar lentigos present  Commons side effects, risks, benefits, and alternatives for medications and treatment plan prescribed today were discussed, and the patient expressed understanding of the given instructions. Patient is instructed to call or message via MyChart if he/she has any questions or concerns regarding our treatment plan. No barriers to understanding were identified. We discussed Red Flag symptoms and signs in detail.  Patient expressed understanding regarding what to do in case of urgent or emergency type symptoms.  Medication list was reconciled, printed and provided to the patient in AVS. Patient instructions and summary information was reviewed with the patient as documented in the AVS. This note was prepared with assistance of Dragon voice recognition software. Occasional wrong-word or sound-a-like substitutions may have occurred due to the inherent limitations of voice recognition software

## 2023-04-12 ENCOUNTER — Other Ambulatory Visit: Payer: Self-pay

## 2023-04-12 ENCOUNTER — Telehealth: Payer: Self-pay | Admitting: Family Medicine

## 2023-04-12 MED ORDER — IRON (FERROUS SULFATE) 325 (65 FE) MG PO TABS: 325.0000 mg | ORAL_TABLET | Freq: Three times a day (TID) | ORAL | 3 refills | Status: DC

## 2023-04-12 NOTE — Telephone Encounter (Signed)
Pt would like a call back with lab results because when she went for her colonoscopy consult they told her she was anemic and was surprised because she did not know and was not told the results from this office. Please advise.

## 2023-04-12 NOTE — Progress Notes (Signed)
Call patient. Labs show a significant anemia again: needs to restart iron 3x/day. If having heavy cycles, can discuss with GYN again.  She is seeing GI for evaluation as well.  Other lab work is all stable. Will need a repeat CBC and iron studies in 12 weeks. Please schedule and I have ordered the tests. The 10-year ASCVD risk score (Arnett DK, et al., 2019) is: 1.5%   Values used to calculate the score:     Age: 47 years     Sex: Female     Is Non-Hispanic African American: No     Diabetic: No     Tobacco smoker: No     Systolic Blood Pressure: 122 mmHg     Is BP treated: Yes     HDL Cholesterol: 38 mg/dL     Total Cholesterol: 158 mg/dL

## 2023-04-12 NOTE — Telephone Encounter (Signed)
See lab result note.

## 2023-04-12 NOTE — Addendum Note (Signed)
Addended by: Asencion Partridge on: 04/12/2023 12:52 PM   Modules accepted: Orders

## 2023-04-27 ENCOUNTER — Other Ambulatory Visit: Payer: Self-pay | Admitting: Family Medicine

## 2023-04-27 DIAGNOSIS — I10 Essential (primary) hypertension: Secondary | ICD-10-CM

## 2023-06-13 LAB — RESULTS CONSOLE HPV: CHL HPV: NEGATIVE

## 2023-06-13 LAB — HM PAP SMEAR

## 2023-07-05 ENCOUNTER — Other Ambulatory Visit (INDEPENDENT_AMBULATORY_CARE_PROVIDER_SITE_OTHER): Payer: PRIVATE HEALTH INSURANCE

## 2023-07-05 DIAGNOSIS — D509 Iron deficiency anemia, unspecified: Secondary | ICD-10-CM

## 2023-07-05 DIAGNOSIS — N920 Excessive and frequent menstruation with regular cycle: Secondary | ICD-10-CM | POA: Diagnosis not present

## 2023-07-05 LAB — CBC WITH DIFFERENTIAL/PLATELET
Basophils Absolute: 0 K/uL (ref 0.0–0.1)
Basophils Relative: 0.5 % (ref 0.0–3.0)
Eosinophils Absolute: 0.4 K/uL (ref 0.0–0.7)
Eosinophils Relative: 5.7 % — ABNORMAL HIGH (ref 0.0–5.0)
HCT: 41.9 % (ref 36.0–46.0)
Hemoglobin: 14 g/dL (ref 12.0–15.0)
Lymphocytes Relative: 22.5 % (ref 12.0–46.0)
Lymphs Abs: 1.5 K/uL (ref 0.7–4.0)
MCHC: 33.4 g/dL (ref 30.0–36.0)
MCV: 83.7 fl (ref 78.0–100.0)
Monocytes Absolute: 0.5 K/uL (ref 0.1–1.0)
Monocytes Relative: 7 % (ref 3.0–12.0)
Neutro Abs: 4.2 K/uL (ref 1.4–7.7)
Neutrophils Relative %: 64.3 % (ref 43.0–77.0)
Platelets: 249 K/uL (ref 150.0–400.0)
RBC: 5.01 Mil/uL (ref 3.87–5.11)
RDW: 21.7 % — ABNORMAL HIGH (ref 11.5–15.5)
WBC: 6.6 K/uL (ref 4.0–10.5)

## 2023-07-06 LAB — IRON,TIBC AND FERRITIN PANEL
%SAT: 77 % — ABNORMAL HIGH (ref 16–45)
Ferritin: 14 ng/mL — ABNORMAL LOW (ref 16–232)
Iron: 283 ug/dL — ABNORMAL HIGH (ref 40–190)
TIBC: 368 ug/dL (ref 250–450)

## 2023-07-19 ENCOUNTER — Encounter: Payer: Self-pay | Admitting: Family Medicine

## 2023-07-19 NOTE — Progress Notes (Signed)
Please call pt: anemia has corrected. Iron levels are improved. May decrease back to one iron tab daily.  Thanks

## 2023-12-03 ENCOUNTER — Encounter: Payer: Self-pay | Admitting: Family Medicine

## 2023-12-03 ENCOUNTER — Ambulatory Visit: Admitting: Family Medicine

## 2023-12-03 VITALS — BP 137/84 | HR 79 | Temp 97.9°F | Ht 60.0 in | Wt 133.4 lb

## 2023-12-03 DIAGNOSIS — D500 Iron deficiency anemia secondary to blood loss (chronic): Secondary | ICD-10-CM

## 2023-12-03 DIAGNOSIS — N924 Excessive bleeding in the premenopausal period: Secondary | ICD-10-CM | POA: Diagnosis not present

## 2023-12-03 DIAGNOSIS — I10 Essential (primary) hypertension: Secondary | ICD-10-CM

## 2023-12-03 NOTE — Progress Notes (Signed)
 Subjective  CC:  Chief Complaint  Patient presents with   long periods    She had her cycle from 11/13/2023 until yesterday. This is the first that it has been this long    HPI: Martha Blankenship is a 48 y.o. female who presents to the office today to address the problems listed above in the chief complaint. Discussed the use of AI scribe software for clinical note transcription with the patient, who gave verbal consent to proceed.  History of Present Illness   Martha Blankenship is a 48 year old female who presents with prolonged menstrual bleeding.  She has been experiencing prolonged menstrual bleeding that began on November 13, 2023, and continued until December 02, 2023. Her menstrual cycles are typically regular, and this is the first time she has experienced such an extended period. The bleeding started normally on November 15, 2023, but continued longer than usual. She mentions experiencing clots during this cycle and no significant pain, although some discomfort was noted on the last day of bleeding.  Her menstrual history includes regular cycles without previous irregularities or heavy bleeding. She has not used birth control pills due to concerns about side effects. She is sexually active and relies on her husband's methods for contraception.  She has previously visited a gynecologist, with her last visit in August 2024. She does not recall having an ultrasound during her previous gynecological visits.  She is currently taking iron supplements, especially during her recent prolonged bleeding, and has increased her intake to twice a day during this period. Her blood pressure is slightly elevated today, which she attributes to stress from the prolonged bleeding.     HTN: mildly elevated in office today: pt admits to feeling stressed about her menstrual cycle. No cp. Compliant with hydrochlorothiazide 12.5daily. wieght is down.   Assessment  1. Abnormal perimenopausal bleeding   2. Iron deficiency  anemia due to chronic blood loss   3. Essential hypertension      Plan  Assessment and Plan    Perimenopause and h/o IDA and menorrhagia Menstrual irregularities likely due to perimenopause. Other causes like fibroids or polyps considered but less likely. Discussed management options including hormonal treatments or IUD with gynecologist referral. - Continue iron supplementation. - check labs - Instruct to track menstrual cycle. - Refer to gynecologist for further evaluation and management.  Hypertension Slightly elevated blood pressure likely due to stress from menstrual issues.  - Monitor blood pressure, reassess next visit. Continue hctz - Encourage stress management and healthy lifestyle.  General Health Maintenance Weight loss since last visit noted as positive. - Encourage healthy eating habits.        Orders Placed This Encounter  Procedures   CBC with Differential/Platelet   Iron, TIBC and Ferritin Panel   No orders of the defined types were placed in this encounter.    I reviewed the patients updated PMH, FH, and SocHx.    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    Allergies: Patient has no known allergies. Family History: Patient family history includes Alzheimer's disease in her maternal grandmother; Bone cancer in her maternal grandmother; Diabetes in her mother;  Healthy in her son; Hypertension in her father and paternal grandfather. Social History:  Patient  reports that she has never smoked. She has never used smokeless tobacco. She reports that she does not currently use alcohol. She reports that she does not use drugs.  Review of Systems: Constitutional: Negative for fever malaise or anorexia Cardiovascular: negative for chest pain Respiratory: negative for SOB or persistent cough Gastrointestinal: negative for abdominal pain  Objective  Vitals: BP 137/84   Pulse 79   Temp 97.9 F (36.6 C)   Ht 5' (1.524 m)   Wt 133 lb 6.4 oz (60.5 kg)   SpO2 98%   BMI 26.05 kg/m  General: no acute distress , A&Ox3 HEENT: PEERL, conjunctiva normal, neck is supple Cardiovascular:  RRR without murmur or gallop.  Respiratory:  Good breath sounds bilaterally, CTAB with normal respiratory effort Nontender abdomen Skin:  Warm, no rashes  Commons side effects, risks, benefits, and alternatives for medications and treatment plan prescribed today were discussed, and the patient expressed understanding of the given instructions. Patient is instructed to call or message via MyChart if he/she has any questions or concerns regarding our treatment plan. No barriers to understanding were identified. We discussed Red Flag symptoms and signs in detail. Patient expressed understanding regarding what to do in case of urgent or emergency type symptoms.  Medication list was reconciled, printed and provided to the patient in AVS. Patient instructions and summary information was reviewed with the patient as documented in the AVS. This note was prepared with assistance of Dragon voice recognition software. Occasional wrong-word or sound-a-like substitutions may have occurred due to the inherent limitations of voice recognition software

## 2023-12-03 NOTE — Patient Instructions (Signed)
 VISIT SUMMARY:  Today, you visited [place] due to prolonged menstrual bleeding that lasted from November 13, 2023, to December 02, 2023. This is the first time you have experienced such an extended period, and you mentioned some discomfort and clots during this cycle. We discussed potential causes and management options for your symptoms.  I will release your lab results to you on your MyChart account with further instructions. You may see the results before I do, but when I review them I will send you a message with my report or have my assistant call you if things need to be discussed. Please reply to my message with any questions. Thank you!  YOUR PLAN:  -PERIMENOPAUSE: Perimenopause is the transition period before menopause when hormonal changes can cause menstrual irregularities. We discussed that your prolonged bleeding might be due to perimenopause, although other causes like fibroids or polyps are less likely. You should continue taking your iron supplements,  follow up with a gynecologist for further evaluation and management.  -HYPERTENSION: Hypertension is high blood pressure. Your slightly elevated blood pressure today is likely due to stress from your menstrual issues. We will monitor your blood pressure and reassess it at your next visit. In the meantime, please focus on stress management and maintaining a healthy lifestyle.  -GENERAL HEALTH MAINTENANCE: Your weight loss since the last visit is a positive change. Continue with healthy eating habits to maintain your progress.  INSTRUCTIONS:  Please follow up with a gynecologist for further evaluation and management of your menstrual irregularities. Continue taking your iron supplements and track your menstrual cycle. We have ordered an ultrasound to check for any uterine abnormalities. Additionally, monitor your blood pressure and focus on stress management and a healthy lifestyle. We will reassess your blood pressure at your next visit.

## 2023-12-04 LAB — CBC WITH DIFFERENTIAL/PLATELET
Basophils Absolute: 0.1 10*3/uL (ref 0.0–0.1)
Basophils Relative: 1 % (ref 0.0–3.0)
Eosinophils Absolute: 0.4 10*3/uL (ref 0.0–0.7)
Eosinophils Relative: 5.2 % — ABNORMAL HIGH (ref 0.0–5.0)
HCT: 33.2 % — ABNORMAL LOW (ref 36.0–46.0)
Hemoglobin: 11.3 g/dL — ABNORMAL LOW (ref 12.0–15.0)
Lymphocytes Relative: 29 % (ref 12.0–46.0)
Lymphs Abs: 2.1 10*3/uL (ref 0.7–4.0)
MCHC: 34.1 g/dL (ref 30.0–36.0)
MCV: 88.4 fl (ref 78.0–100.0)
Monocytes Absolute: 0.5 10*3/uL (ref 0.1–1.0)
Monocytes Relative: 7.1 % (ref 3.0–12.0)
Neutro Abs: 4.3 10*3/uL (ref 1.4–7.7)
Neutrophils Relative %: 57.7 % (ref 43.0–77.0)
Platelets: 345 10*3/uL (ref 150.0–400.0)
RBC: 3.75 Mil/uL — ABNORMAL LOW (ref 3.87–5.11)
RDW: 13.3 % (ref 11.5–15.5)
WBC: 7.4 10*3/uL (ref 4.0–10.5)

## 2023-12-04 LAB — IRON,TIBC AND FERRITIN PANEL
%SAT: 41 % (ref 16–45)
Ferritin: 12 ng/mL — ABNORMAL LOW (ref 16–232)
Iron: 148 ug/dL (ref 40–190)
TIBC: 361 ug/dL (ref 250–450)

## 2023-12-17 ENCOUNTER — Encounter: Payer: Self-pay | Admitting: Family Medicine

## 2023-12-17 NOTE — Progress Notes (Signed)
 See mychart note Dear Ms. Cange, Your lab results from earlier this month confirm that you are mildly anemic again. Please take your iron  pills 3x/day and see the gynecologist to help get your menstrual cycles controlled.  Sincerely, Dr. Jonelle Neri

## 2024-04-08 ENCOUNTER — Encounter: Payer: Self-pay | Admitting: Family Medicine

## 2024-04-08 ENCOUNTER — Ambulatory Visit (INDEPENDENT_AMBULATORY_CARE_PROVIDER_SITE_OTHER): Payer: Commercial Managed Care - PPO | Admitting: Family Medicine

## 2024-04-08 VITALS — BP 141/94 | HR 70 | Temp 97.7°F | Ht 60.0 in | Wt 138.0 lb

## 2024-04-08 DIAGNOSIS — Z0001 Encounter for general adult medical examination with abnormal findings: Secondary | ICD-10-CM | POA: Diagnosis not present

## 2024-04-08 DIAGNOSIS — I10 Essential (primary) hypertension: Secondary | ICD-10-CM | POA: Diagnosis not present

## 2024-04-08 DIAGNOSIS — Z1211 Encounter for screening for malignant neoplasm of colon: Secondary | ICD-10-CM | POA: Diagnosis not present

## 2024-04-08 DIAGNOSIS — K219 Gastro-esophageal reflux disease without esophagitis: Secondary | ICD-10-CM | POA: Diagnosis not present

## 2024-04-08 DIAGNOSIS — Z1212 Encounter for screening for malignant neoplasm of rectum: Secondary | ICD-10-CM

## 2024-04-08 DIAGNOSIS — N920 Excessive and frequent menstruation with regular cycle: Secondary | ICD-10-CM | POA: Diagnosis not present

## 2024-04-08 DIAGNOSIS — D500 Iron deficiency anemia secondary to blood loss (chronic): Secondary | ICD-10-CM

## 2024-04-08 DIAGNOSIS — R7301 Impaired fasting glucose: Secondary | ICD-10-CM | POA: Diagnosis not present

## 2024-04-08 LAB — COMPREHENSIVE METABOLIC PANEL WITH GFR
ALT: 27 U/L (ref 0–35)
AST: 21 U/L (ref 0–37)
Albumin: 4 g/dL (ref 3.5–5.2)
Alkaline Phosphatase: 56 U/L (ref 39–117)
BUN: 15 mg/dL (ref 6–23)
CO2: 28 meq/L (ref 19–32)
Calcium: 8.6 mg/dL (ref 8.4–10.5)
Chloride: 104 meq/L (ref 96–112)
Creatinine, Ser: 0.52 mg/dL (ref 0.40–1.20)
GFR: 108.72 mL/min (ref >60.00)
Glucose, Bld: 98 mg/dL (ref 70–99)
Potassium: 3.4 meq/L — ABNORMAL LOW (ref 3.5–5.1)
Sodium: 139 meq/L (ref 135–145)
Total Bilirubin: 0.7 mg/dL (ref 0.2–1.2)
Total Protein: 7.2 g/dL (ref 6.0–8.3)

## 2024-04-08 LAB — CBC WITH DIFFERENTIAL/PLATELET
Basophils Absolute: 0 K/uL (ref 0.0–0.1)
Basophils Relative: 0.3 % (ref 0.0–3.0)
Eosinophils Absolute: 0.2 K/uL (ref 0.0–0.7)
Eosinophils Relative: 4.3 % (ref 0.0–5.0)
HCT: 39.6 % (ref 36.0–46.0)
Hemoglobin: 13.2 g/dL (ref 12.0–15.0)
Lymphocytes Relative: 29.3 % (ref 12.0–46.0)
Lymphs Abs: 1.5 K/uL (ref 0.7–4.0)
MCHC: 33.3 g/dL (ref 30.0–36.0)
MCV: 87.2 fl (ref 78.0–100.0)
Monocytes Absolute: 0.4 K/uL (ref 0.1–1.0)
Monocytes Relative: 7.6 % (ref 3.0–12.0)
Neutro Abs: 3 K/uL (ref 1.4–7.7)
Neutrophils Relative %: 58.5 % (ref 43.0–77.0)
Platelets: 270 K/uL (ref 150.0–400.0)
RBC: 4.54 Mil/uL (ref 3.87–5.11)
RDW: 13.2 % (ref 11.5–15.5)
WBC: 5.1 K/uL (ref 4.0–10.5)

## 2024-04-08 LAB — TSH: TSH: 0.85 u[IU]/mL (ref 0.35–5.50)

## 2024-04-08 LAB — LIPID PANEL
Cholesterol: 192 mg/dL (ref 0–200)
HDL: 35.6 mg/dL — ABNORMAL LOW (ref >39.00)
LDL Cholesterol: 127 mg/dL — ABNORMAL HIGH (ref 0–99)
NonHDL: 156.79
Total CHOL/HDL Ratio: 5
Triglycerides: 148 mg/dL (ref 0.0–149.0)
VLDL: 29.6 mg/dL (ref 0.0–40.0)

## 2024-04-08 LAB — HEMOGLOBIN A1C: Hgb A1c MFr Bld: 5.7 % (ref 4.6–6.5)

## 2024-04-08 MED ORDER — LISINOPRIL-HYDROCHLOROTHIAZIDE 10-12.5 MG PO TABS: 1.0000 | ORAL_TABLET | Freq: Every day | ORAL | 3 refills | Status: DC

## 2024-04-08 NOTE — Patient Instructions (Addendum)
 Please return in 3 months to recheck blood pressure   I will release your lab results to you on your MyChart account with further instructions. You may see the results before I do, but when I review them I will send you a message with my report or have my assistant call you if things need to be discussed. Please reply to my message with any questions. Thank you!   I recommend the Cologuard test for your colon cancer screening that is due. I have ordered this test for you. The Cologuard company will soon contact you to verify your insurance, address etc. They will then send you the kit; follow the instructions in the kit and return the kit to [employer]. They will run the test and send the results to me. I will then give you the results. If this test is negative, we recommend repeating a colon cancer screening test in 3 years. If it is positive, I will refer you to a Gastroenterologist so you can get set up for the recommended colonoscopy.  Thank you!   If you have any questions or concerns, please don't hesitate to send me a message via MyChart or call the office at [phone]. Thank you for visiting with us  today! It's our pleasure caring for you.    VISIT SUMMARY: Today, we discussed your concerns about heavy menstrual bleeding and elevated blood pressure. We reviewed your recent episode of heavy menstrual bleeding, your current hypertension management, and your iron  levels. We also talked about the importance of monitoring your blood pressure regularly and the need for a colonoscopy.  YOUR PLAN: -PERIMENOPAUSAL ABNORMAL UTERINE BLEEDING: You experienced a single episode of heavy menstrual bleeding, which may be related to stress or perimenopause. This can happen as you approach menopause. We recommend discussing management strategies with your gynecologist to prevent future episodes and anemia.  -HYPERTENSION: Your blood pressure was elevated today, and it has been borderline high in the past.  Hypertension means high blood pressure, which can lead to other health problems if not managed well. We will switch your current medication to a combination pill to better control your blood pressure. Please monitor your blood pressure regularly at home and follow up in 3 months to reassess.  -IRON  DEFICIENCY ANEMIA: Anemia means you have a lower than normal number of red blood cells, likely due to your previous heavy menstrual bleeding. You are currently taking iron  supplements once daily. We will check your blood levels to see if the iron  supplementation is adequate and adjust if necessary.  INSTRUCTIONS: Please schedule a follow-up appointment in 3 months to reassess your blood pressure. Also, make sure to discuss management of your menstrual bleeding with your gynecologist. It is important to monitor your blood pressure regularly at home.                      Contains text generated by Abridge.                                 Contains text generated by Abridge.

## 2024-04-08 NOTE — Progress Notes (Signed)
 Subjective  Chief Complaint  Patient presents with   Annual Exam    Pt here for Annual Exam and is currently fasting    Hypertension    HPI: Martha Blankenship is a 48 y.o. female who presents to [facility] [place] today for a Female Wellness Visit. She also has the concerns and/or needs as listed above in the chief complaint. These will be addressed in addition to the Health Maintenance Visit.   Wellness Visit: annual visit with health maintenance review and exam  HM: sees gyn. Had pap in October. Records verified. Mammo as well: normal. Due CRC screen avg risk.  Imms up to date. Works full time Education officer, [employer]. Family is doing well.   Chronic disease f/u and/or acute problem visit: (deemed necessary to be done in addition to the wellness visit): Discussed the use of AI scribe software for clinical note transcription with the patient, who gave verbal consent to proceed.  History of Present Illness Martha Blankenship is a 48 year old female with hypertension who presents with concerns about heavy menstrual bleeding and elevated blood pressure.  She experienced a single episode of heavy menstrual bleeding lasting two weeks, after which her menstrual cycles returned to regularity. She underwent a Pap smear and mammogram around the time of the bleeding. Her gynecologist suggested the bleeding might have been due to stress or perimenopause. Taking iron  once a day  She has a history of hypertension and is currently taking Micrazide once daily. She has not been monitoring her blood pressure outside of the office. She experiences headaches, which she attributes to elevated blood pressure. No increased thirst, blurred vision, or increased urination. No cp or sob  She has not scheduled a colonoscopy this year and expresses apprehension about the preparation process. There is no family history of colon cancer, but there is a history of breast cancer and high blood pressure in her  family.  No active gerd sxs.   Assessment  1. Encounter for well adult exam with abnormal findings   2. Essential hypertension   3. Gastroesophageal reflux disease without esophagitis   4. Iron  deficiency anemia due to chronic blood loss   5. Menorrhagia with regular cycle   6. IFG (impaired fasting glucose)   7. Screening for colorectal cancer      Plan  Female Wellness Visit: Age appropriate Health Maintenance and Prevention measures were discussed with patient. Included topics are cancer screening recommendations, ways to keep healthy (see AVS) including dietary and exercise recommendations, regular eye and dental care, use of seat belts, and avoidance of moderate alcohol use and tobacco use. Education given on options for crc screen: elects cologuard. Other screens current BMI: discussed patient's BMI and encouraged positive lifestyle modifications to help get to or maintain a target BMI. HM needs and immunizations were addressed and ordered. See below for orders. See HM and immunization section for updates. Routine labs and screening tests ordered including cmp, cbc and lipids where appropriate. Discussed recommendations regarding Vit D and calcium supplementation (see AVS)  Chronic disease management visit and/or acute problem visit: Assessment and Plan Assessment & Plan Perimenopausal abnormal uterine bleeding Single episode of heavy bleeding possibly stress-related and perimenopausal. Regular periods now. Risk of recurrence during menopause. Discussed management strategies with gynecologist to prevent future episodes and anemia. - Discuss management of bleeding through menopause with gynecologist.  Hypertension Blood pressure elevated today and borderline high in April. Not monitoring outside office. Current medication Micrazide insufficient. Proposed switching  to combination pill. Headaches possibly related to elevated blood pressure. - Change current medication to a  combination pill for better blood pressure control. Zestoretic  10/12.5 - Follow up in 3 months to reassess blood pressure.  Iron  deficiency anemia Anemia likely due to previous heavy menstrual bleeding. Taking iron  once daily. Plan to check blood levels to assess anemia and adequacy of iron  supplementation. - Check blood levels to assess for anemia and iron  levels. - Adjust iron  supplementation if necessary.   Follow up: 3 mo to recheck bp  Orders Placed This Encounter  Procedures   HM PAP SMEAR   Iron , TIBC and Ferritin Panel   CBC with Differential/Platelet   Comprehensive metabolic panel with GFR   Lipid panel   Hemoglobin A1c   TSH   Cologuard   Meds ordered this encounter  Medications   lisinopril -hydrochlorothiazide  (ZESTORETIC ) 10-12.5 MG tablet    Sig: Take 1 tablet by mouth daily.    Dispense:  90 tablet    Refill:  3      Body mass index is 26.95 kg/m. Wt Readings from Last 3 Encounters:  04/08/24 138 lb (62.6 kg)  12/03/23 133 lb 6.4 oz (60.5 kg)  04/05/23 139 lb 12.8 oz (63.4 kg)     Patient Active Problem List   Diagnosis Date Noted   Essential hypertension 03/28/2021    Priority: High    Microzide  started 2023, changed to lisinopril  hct 10/12.5 03/2024    GERD (gastroesophageal reflux disease) 07/02/2019    Priority: Medium     Normal EGD 2019 for bloating. Improved with PPI. Hispanic diet.    Menorrhagia with regular cycle 07/02/2019    Priority: Medium    IDA (iron  deficiency anemia) 07/02/2019    Priority: Medium     Recurrent. 03/2023: resolved with oral supplements.     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    Priority: Low    confirmed angioma by Derm    IFG (impaired fasting glucose) 04/08/2024   Health Maintenance  Topic Date Due   Hepatitis B Vaccines (1 of 3 - 19+ 3-dose series) Never done   Colonoscopy  Never done   COVID-19 Vaccine (3 - 2024-25 season) 04/24/2024  (Originally 04/29/2023)   MAMMOGRAM  06/12/2024   Cervical Cancer Screening (HPV/Pap Cotest)  06/12/2028   DTaP/Tdap/Td (4 - Td or Tdap) 12/31/2029   Hepatitis C Screening  Completed   HIV Screening  Completed   HPV VACCINES  Aged Out   Meningococcal B Vaccine  Aged Out   INFLUENZA VACCINE  Discontinued   Immunization History  Administered Date(s) Administered   Janssen (J&J) SARS-COV-2 Vaccination 11/18/2019   PFIZER(Purple Top)SARS-COV-2 Vaccination 07/24/2020   Tdap 09/28/2006, 12/12/2016, 01/01/2020   We updated and reviewed the patient's past history in detail and it is documented below. Allergies: Patient has no known allergies. 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 Past Surgical History Patient  has a past surgical history that includes Cesarean section and Breast biopsy (Right, 12/2014). Family History: Patient family history includes Alzheimer's disease in her maternal grandmother; Bone cancer in her maternal grandmother; Diabetes in her mother; Healthy in her son; Hypertension in her father and paternal grandfather. Social History:  Patient  reports that she has never smoked. She has never used smokeless tobacco. She reports that she does  not currently use alcohol. She reports that she does not use drugs.  Review of Systems: Constitutional: negative for fever or malaise Ophthalmic: negative for photophobia, double vision or loss of vision Cardiovascular: negative for chest pain, dyspnea on exertion, or new LE swelling Respiratory: negative for SOB or persistent cough Gastrointestinal: negative for abdominal pain, change in bowel habits or melena Genitourinary: negative for dysuria or gross hematuria, no abnormal uterine bleeding or disharge Musculoskeletal: negative for new gait disturbance or muscular weakness Integumentary: negative for new  or persistent rashes, no breast lumps Neurological: negative for TIA or stroke symptoms Psychiatric: negative for SI or delusions Allergic/Immunologic: negative for hives  Patient Care Team    Relationship Specialty Notifications Start End  Jodie Lavern CROME, MD PCP - General Family Medicine  07/02/19   Gretta Gums, MD Consulting Physician Obstetrics  07/02/19   Kristie Lamprey, MD Consulting Physician Gastroenterology  07/02/19   Ob/Gyn, Landy Stains    09/28/21     Objective  Vitals: BP (!) 141/94   Pulse 70   Temp 97.7 F (36.5 C)   Ht 5' (1.524 m)   Wt 138 lb (62.6 kg)   SpO2 98%   BMI 26.95 kg/m  General:  Well developed, well nourished, no acute distress  Psych:  Alert and orientedx3,normal mood and affect HEENT:  Normocephalic, atraumatic, non-icteric sclera,  supple neck without adenopathy, mass or thyromegaly Cardiovascular:  Normal S1, S2, RRR without gallop, rub or murmur Respiratory:  Good breath sounds bilaterally, CTAB with normal respiratory effort Gastrointestinal: normal bowel sounds, soft, non-tender, no noted masses. No HSM MSK: extremities without edema, joints without erythema or swelling Neurologic:    Mental status is normal.  Gross motor and sensory exams are normal.  No tremor  Commons side effects, risks, benefits, and alternatives for medications and treatment plan prescribed today were discussed, and the patient expressed understanding of the given instructions. Patient is instructed to call or message via MyChart if he/she has any questions or concerns regarding our treatment plan. No barriers to understanding were identified. We discussed Red Flag symptoms and signs in detail. Patient expressed understanding regarding what to do in case of urgent or emergency type symptoms.  Medication list was reconciled, printed and provided to the patient in AVS. Patient instructions and summary information was reviewed with the patient as documented in the AVS. This note  was prepared with assistance of Dragon voice recognition software. Occasional wrong-word or sound-a-like substitutions may have occurred due to the inherent limitations of voice recognition software

## 2024-04-09 LAB — IRON,TIBC AND FERRITIN PANEL
%SAT: 23 % (ref 16–45)
Ferritin: 19 ng/mL (ref 16–232)
Iron: 81 ug/dL (ref 40–190)
TIBC: 360 ug/dL (ref 250–450)

## 2024-04-13 ENCOUNTER — Ambulatory Visit: Payer: Self-pay | Admitting: Family Medicine

## 2024-04-13 NOTE — Progress Notes (Signed)
 Labs reviewed.  The 10-year ASCVD risk score (Arnett DK, et al., 2019) is: 3.1%   Values used to calculate the score:     Age: 48 years     Clincally relevant sex: Female     Is Non-Hispanic African American: No     Diabetic: No     Tobacco smoker: No     Systolic Blood Pressure: 141 mmHg     Is BP treated: Yes     HDL Cholesterol: 35.6 mg/dL     Total Cholesterol: 192 mg/dL

## 2024-05-30 ENCOUNTER — Other Ambulatory Visit: Payer: Self-pay | Admitting: Family Medicine

## 2024-07-09 ENCOUNTER — Ambulatory Visit: Admitting: Family Medicine

## 2024-07-09 ENCOUNTER — Encounter: Payer: Self-pay | Admitting: Family Medicine

## 2024-07-09 ENCOUNTER — Other Ambulatory Visit: Payer: Self-pay | Admitting: Obstetrics

## 2024-07-09 VITALS — BP 130/86 | HR 73 | Temp 97.9°F | Ht 60.0 in | Wt 142.6 lb

## 2024-07-09 DIAGNOSIS — I10 Essential (primary) hypertension: Secondary | ICD-10-CM | POA: Diagnosis not present

## 2024-07-09 DIAGNOSIS — R519 Headache, unspecified: Secondary | ICD-10-CM

## 2024-07-09 DIAGNOSIS — R928 Other abnormal and inconclusive findings on diagnostic imaging of breast: Secondary | ICD-10-CM

## 2024-07-09 MED ORDER — LISINOPRIL-HYDROCHLOROTHIAZIDE 20-12.5 MG PO TABS: 1.0000 | ORAL_TABLET | Freq: Every day | ORAL | 3 refills | Status: AC

## 2024-07-09 NOTE — Patient Instructions (Signed)
 Please return in 3 months for hypertension recheck    If you have any questions or concerns, please don't hesitate to send me a message via MyChart or call the office at [phone]. Thank you for visiting with us  today! It's our pleasure caring for you.    VISIT SUMMARY: Today, we discussed your blood pressure management and addressed your headaches. Your blood pressure has improved but is not yet at the target level. We also talked about your ongoing stress and its impact on your health.  YOUR PLAN: -ESSENTIAL HYPERTENSION: Essential hypertension means high blood pressure without a known secondary cause. Your blood pressure has improved but is still not at the target level. We have increased your dose of lisinopril  HCT to help manage it better. Please continue taking your medication as prescribed and monitor your blood pressure regularly. We will recheck your blood pressure in three months.   INSTRUCTIONS: Please schedule a follow-up appointment in three months to recheck your blood pressure. Continue taking your medication as prescribed and monitor your blood pressure regularly. If you experience any side effects or if your headaches worsen, please contact our office.                      Contains text generated by Abridge.                                 Contains text generated by Abridge.

## 2024-07-09 NOTE — Progress Notes (Signed)
 Subjective  CC:  Chief Complaint  Patient presents with   Hypertension    HPI: Martha Blankenship is a 48 y.o. female who presents to the office today to address the problems listed above in the chief complaint. Hypertension f/u:  Discussed the use of AI scribe software for clinical note transcription with the patient, who gave verbal consent to proceed.  History of Present Illness Martha Blankenship is a 48 year old female with hypertension who presents for follow-up of her blood pressure management.  Hypertension - Currently managed with lisinopril  hydrochlorothiazide  10/12.5 for 3 months, started 3 months ago. Adjusted up form microzide .  - No side effects from lisinopril  - Ongoing stress: cousin had a stroke. Lives in [place]; same age as patient. - headaches have resolved with improvement in BP - weight is up a few pounds  Wt Readings from Last 3 Encounters:  07/09/24 142 lb 9.6 oz (64.7 kg)  04/08/24 138 lb (62.6 kg)  12/03/23 133 lb 6.4 oz (60.5 kg)      Assessment  1. Essential hypertension   2. Nonintractable episodic headache, unspecified headache type      Plan  Assessment and Plan Assessment & Plan Essential hypertension Blood pressure is improved but not at goal. No side effects from current medication regimen. - Increased lisinopril  HCT dose to 20/12.5; this should bring her to goal.  - Will recheck blood pressure in three months - rec weight loss and low sodium - headaches have resolved and were likely related to elevated BP , reassured   Education regarding management of these chronic disease states was given. Management strategies discussed on successive visits include dietary and exercise recommendations, goals of achieving and maintaining IBW, and lifestyle modifications aiming for adequate sleep and minimizing stressors.  Follow up: 3 mo for htn recheck  No orders of the defined types were placed in this encounter.  Meds ordered this encounter   Medications   lisinopril -hydrochlorothiazide  (ZESTORETIC ) 20-12.5 MG tablet    Sig: Take 1 tablet by mouth daily.    Dispense:  90 tablet    Refill:  3      BP Readings from Last 3 Encounters:  07/09/24 130/86  04/08/24 (!) 141/94  12/03/23 137/84   Wt Readings from Last 3 Encounters:  07/09/24 142 lb 9.6 oz (64.7 kg)  04/08/24 138 lb (62.6 kg)  12/03/23 133 lb 6.4 oz (60.5 kg)    Lab Results  Component Value Date   CHOL 192 04/08/2024   CHOL 158 04/05/2023   CHOL 144 04/03/2022   Lab Results  Component Value Date   HDL 35.60 (L) 04/08/2024   HDL 38.00 (L) 04/05/2023   HDL 28.90 (L) 04/03/2022   Lab Results  Component Value Date   LDLCALC 127 (H) 04/08/2024   LDLCALC 93 04/05/2023   LDLCALC 81 04/03/2022   Lab Results  Component Value Date   TRIG 148.0 04/08/2024   TRIG 131.0 04/05/2023   TRIG 171.0 (H) 04/03/2022   Lab Results  Component Value Date   CHOLHDL 5 04/08/2024   CHOLHDL 4 04/05/2023   CHOLHDL 5 04/03/2022   No results found for: LDLDIRECT Lab Results  Component Value Date   CREATININE 0.52 04/08/2024   BUN 15 04/08/2024   NA 139 04/08/2024   K 3.4 (L) 04/08/2024   CL 104 04/08/2024   CO2 28 04/08/2024    The 10-year ASCVD risk score (Arnett DK, et al., 2019) is: 2.8%   Values used to calculate the score:  Age: 80 years     Clincally relevant sex: Female     Is Non-Hispanic African American: No     Diabetic: No     Tobacco smoker: No     Systolic Blood Pressure: 130 mmHg     Is BP treated: Yes     HDL Cholesterol: 35.6 mg/dL     Total Cholesterol: 192 mg/dL  I reviewed the patients updated PMH, FH, and SocHx.    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  Social History: Patient  reports that she has never smoked. She has never used smokeless tobacco. She reports that she does not currently use alcohol. She reports that she does not use drugs.  Current Meds  Medication Sig   ferrous sulfate  325 (65 FE) MG tablet TAKE 1 TABLET BY MOUTH THREE TIMES A DAY   lisinopril -hydrochlorothiazide  (ZESTORETIC ) 20-12.5 MG tablet Take 1 tablet by mouth daily.   [DISCONTINUED] lisinopril -hydrochlorothiazide  (ZESTORETIC ) 10-12.5 MG tablet Take 1 tablet by mouth daily.    Review of Systems: Cardiovascular: negative for chest pain, palpitations, leg swelling, orthopnea Respiratory: negative for SOB, wheezing or persistent cough Gastrointestinal: negative for abdominal pain Genitourinary: negative for dysuria or gross hematuria  Objective  Vitals: BP 130/86   Pulse 73   Temp 97.9 F (36.6 C)   Ht 5' (1.524 m)   Wt 142 lb 9.6 oz (64.7 kg)   SpO2 99%   BMI 27.85 kg/m  General: no acute distress , appears well Psych:  Alert and oriented, normal mood and affect HEENT:  Normocephalic, atraumatic, supple neck  Cardiovascular:  RRR without murmur. no edema Respiratory:  Good breath sounds bilaterally, CTAB with normal respiratory effort  Commons side effects, risks, benefits, and alternatives for medications and treatment plan prescribed today were discussed, and the patient expressed understanding of the given instructions. Patient is instructed to call or message via MyChart if he/she has any questions or concerns regarding our treatment plan. No barriers to understanding were identified. We discussed Red Flag symptoms and signs in detail. Patient expressed understanding regarding what to do in case of urgent or emergency type symptoms.   Medication list was reconciled, printed and provided to the patient in AVS. Patient instructions and summary information was reviewed with the patient as documented in the AVS. This note was prepared with assistance of Dragon voice recognition software. Occasional wrong-word or sound-a-like substitutions may have occurred due to the inherent limitation

## 2024-07-12 ENCOUNTER — Ambulatory Visit
Admission: RE | Admit: 2024-07-12 | Discharge: 2024-07-12 | Disposition: A | Discharge status: Home / Self Care | Source: Ambulatory Visit | Attending: Obstetrics | Admitting: Obstetrics

## 2024-07-12 ENCOUNTER — Ambulatory Visit
Admission: RE | Admit: 2024-07-12 | Discharge: 2024-07-12 | Disposition: A | Source: Ambulatory Visit | Attending: Obstetrics

## 2024-07-12 DIAGNOSIS — R928 Other abnormal and inconclusive findings on diagnostic imaging of breast: Secondary | ICD-10-CM

## 2024-10-10 ENCOUNTER — Ambulatory Visit: Admitting: Family Medicine
